# Patient Record
Sex: Female | Born: 1955 | Race: White | Hispanic: No | Marital: Married | Smoking: Former smoker
Health system: Southern US, Community
[De-identification: ages and names within clinical notes are randomized; demographics above are authoritative.]

## PROBLEM LIST (undated history)

## (undated) DIAGNOSIS — M1990 Unspecified osteoarthritis, unspecified site: Secondary | ICD-10-CM

## (undated) DIAGNOSIS — E079 Disorder of thyroid, unspecified: Secondary | ICD-10-CM

## (undated) DIAGNOSIS — E039 Hypothyroidism, unspecified: Secondary | ICD-10-CM

## (undated) DIAGNOSIS — M8580 Other specified disorders of bone density and structure, unspecified site: Secondary | ICD-10-CM

## (undated) DIAGNOSIS — K219 Gastro-esophageal reflux disease without esophagitis: Secondary | ICD-10-CM

## (undated) HISTORY — DX: Other specified disorders of bone density and structure, unspecified site: M85.80

## (undated) HISTORY — DX: Hypothyroidism, unspecified: E03.9

## (undated) HISTORY — DX: Unspecified osteoarthritis, unspecified site: M19.90

## (undated) HISTORY — DX: Gastro-esophageal reflux disease without esophagitis: K21.9

## (undated) HISTORY — PX: ELBOW SURGERY: SHX618

## (undated) HISTORY — DX: Disorder of thyroid, unspecified: E07.9

## (undated) HISTORY — PX: KNEE SURGERY: SHX244

---

## 1998-02-07 ENCOUNTER — Ambulatory Visit (HOSPITAL_COMMUNITY): Admission: RE | Admit: 1998-02-07 | Discharge: 1998-02-07 | Payer: Self-pay | Admitting: Obstetrics & Gynecology

## 1998-02-17 ENCOUNTER — Ambulatory Visit (HOSPITAL_COMMUNITY): Admission: RE | Admit: 1998-02-17 | Discharge: 1998-02-17 | Payer: Self-pay | Admitting: Obstetrics & Gynecology

## 1998-02-22 ENCOUNTER — Other Ambulatory Visit: Admission: RE | Admit: 1998-02-22 | Discharge: 1998-02-22 | Payer: Self-pay | Admitting: Obstetrics & Gynecology

## 1998-04-08 ENCOUNTER — Emergency Department (HOSPITAL_COMMUNITY): Admission: EM | Admit: 1998-04-08 | Discharge: 1998-04-08 | Payer: Self-pay | Admitting: Emergency Medicine

## 1998-05-23 ENCOUNTER — Other Ambulatory Visit: Admission: RE | Admit: 1998-05-23 | Discharge: 1998-05-23 | Payer: Self-pay | Admitting: Obstetrics & Gynecology

## 1998-08-26 ENCOUNTER — Ambulatory Visit (HOSPITAL_COMMUNITY): Admission: RE | Admit: 1998-08-26 | Discharge: 1998-08-26 | Payer: Self-pay | Admitting: Obstetrics & Gynecology

## 1998-11-11 ENCOUNTER — Other Ambulatory Visit: Admission: RE | Admit: 1998-11-11 | Discharge: 1998-11-11 | Payer: Self-pay | Admitting: Obstetrics & Gynecology

## 1999-02-28 ENCOUNTER — Ambulatory Visit (HOSPITAL_COMMUNITY): Admission: RE | Admit: 1999-02-28 | Discharge: 1999-02-28 | Payer: Self-pay | Admitting: Obstetrics & Gynecology

## 1999-02-28 ENCOUNTER — Encounter: Payer: Self-pay | Admitting: Obstetrics & Gynecology

## 2000-02-02 ENCOUNTER — Other Ambulatory Visit: Admission: RE | Admit: 2000-02-02 | Discharge: 2000-02-02 | Payer: Self-pay | Admitting: Obstetrics & Gynecology

## 2001-06-12 ENCOUNTER — Other Ambulatory Visit: Admission: RE | Admit: 2001-06-12 | Discharge: 2001-06-12 | Payer: Self-pay | Admitting: Thoracic Surgery

## 2003-04-07 ENCOUNTER — Other Ambulatory Visit: Admission: RE | Admit: 2003-04-07 | Discharge: 2003-04-07 | Payer: Self-pay | Admitting: Obstetrics & Gynecology

## 2003-07-22 ENCOUNTER — Encounter: Admission: RE | Admit: 2003-07-22 | Discharge: 2003-07-22 | Payer: Self-pay | Admitting: Obstetrics & Gynecology

## 2003-07-22 ENCOUNTER — Encounter: Payer: Self-pay | Admitting: Obstetrics & Gynecology

## 2003-08-02 ENCOUNTER — Encounter: Payer: Self-pay | Admitting: Family Medicine

## 2003-08-02 ENCOUNTER — Ambulatory Visit (HOSPITAL_COMMUNITY): Admission: RE | Admit: 2003-08-02 | Discharge: 2003-08-02 | Payer: Self-pay | Admitting: Family Medicine

## 2004-02-16 ENCOUNTER — Encounter: Admission: RE | Admit: 2004-02-16 | Discharge: 2004-02-16 | Payer: Self-pay | Admitting: Obstetrics & Gynecology

## 2004-04-21 ENCOUNTER — Other Ambulatory Visit: Admission: RE | Admit: 2004-04-21 | Discharge: 2004-04-21 | Payer: Self-pay | Admitting: Obstetrics and Gynecology

## 2005-09-21 ENCOUNTER — Other Ambulatory Visit: Admission: RE | Admit: 2005-09-21 | Discharge: 2005-09-21 | Payer: Self-pay | Admitting: Obstetrics & Gynecology

## 2005-10-02 ENCOUNTER — Encounter: Admission: RE | Admit: 2005-10-02 | Discharge: 2005-10-02 | Payer: Self-pay | Admitting: Obstetrics & Gynecology

## 2005-10-08 ENCOUNTER — Emergency Department (HOSPITAL_COMMUNITY): Admission: EM | Admit: 2005-10-08 | Discharge: 2005-10-08 | Payer: Self-pay | Admitting: Emergency Medicine

## 2005-10-09 ENCOUNTER — Ambulatory Visit (HOSPITAL_COMMUNITY): Admission: RE | Admit: 2005-10-09 | Discharge: 2005-10-09 | Payer: Self-pay | Admitting: Emergency Medicine

## 2006-04-03 ENCOUNTER — Encounter: Payer: Self-pay | Admitting: Obstetrics & Gynecology

## 2006-10-14 ENCOUNTER — Encounter: Admission: RE | Admit: 2006-10-14 | Discharge: 2006-10-14 | Payer: Self-pay | Admitting: Obstetrics & Gynecology

## 2006-10-23 ENCOUNTER — Encounter: Admission: RE | Admit: 2006-10-23 | Discharge: 2006-10-23 | Payer: Self-pay | Admitting: Obstetrics & Gynecology

## 2007-09-30 IMAGING — MG MM DIGITAL DIAGNOSTIC LIMITED*L*
2 series · 2 of 2 positions shown · non-contrast
Comparison: none

[REDACTED] LEFT
CC and MLO view(s) were taken of the left breast.

LEFT BREAST ULTRASOUND
DIGITAL LIMITED LEFT DIAGNOSTIC MAMMOGRAM AND LEFT BREAST ULTRASOUND:
CLINICAL DATA: Abnormal screening study.

[L CC]
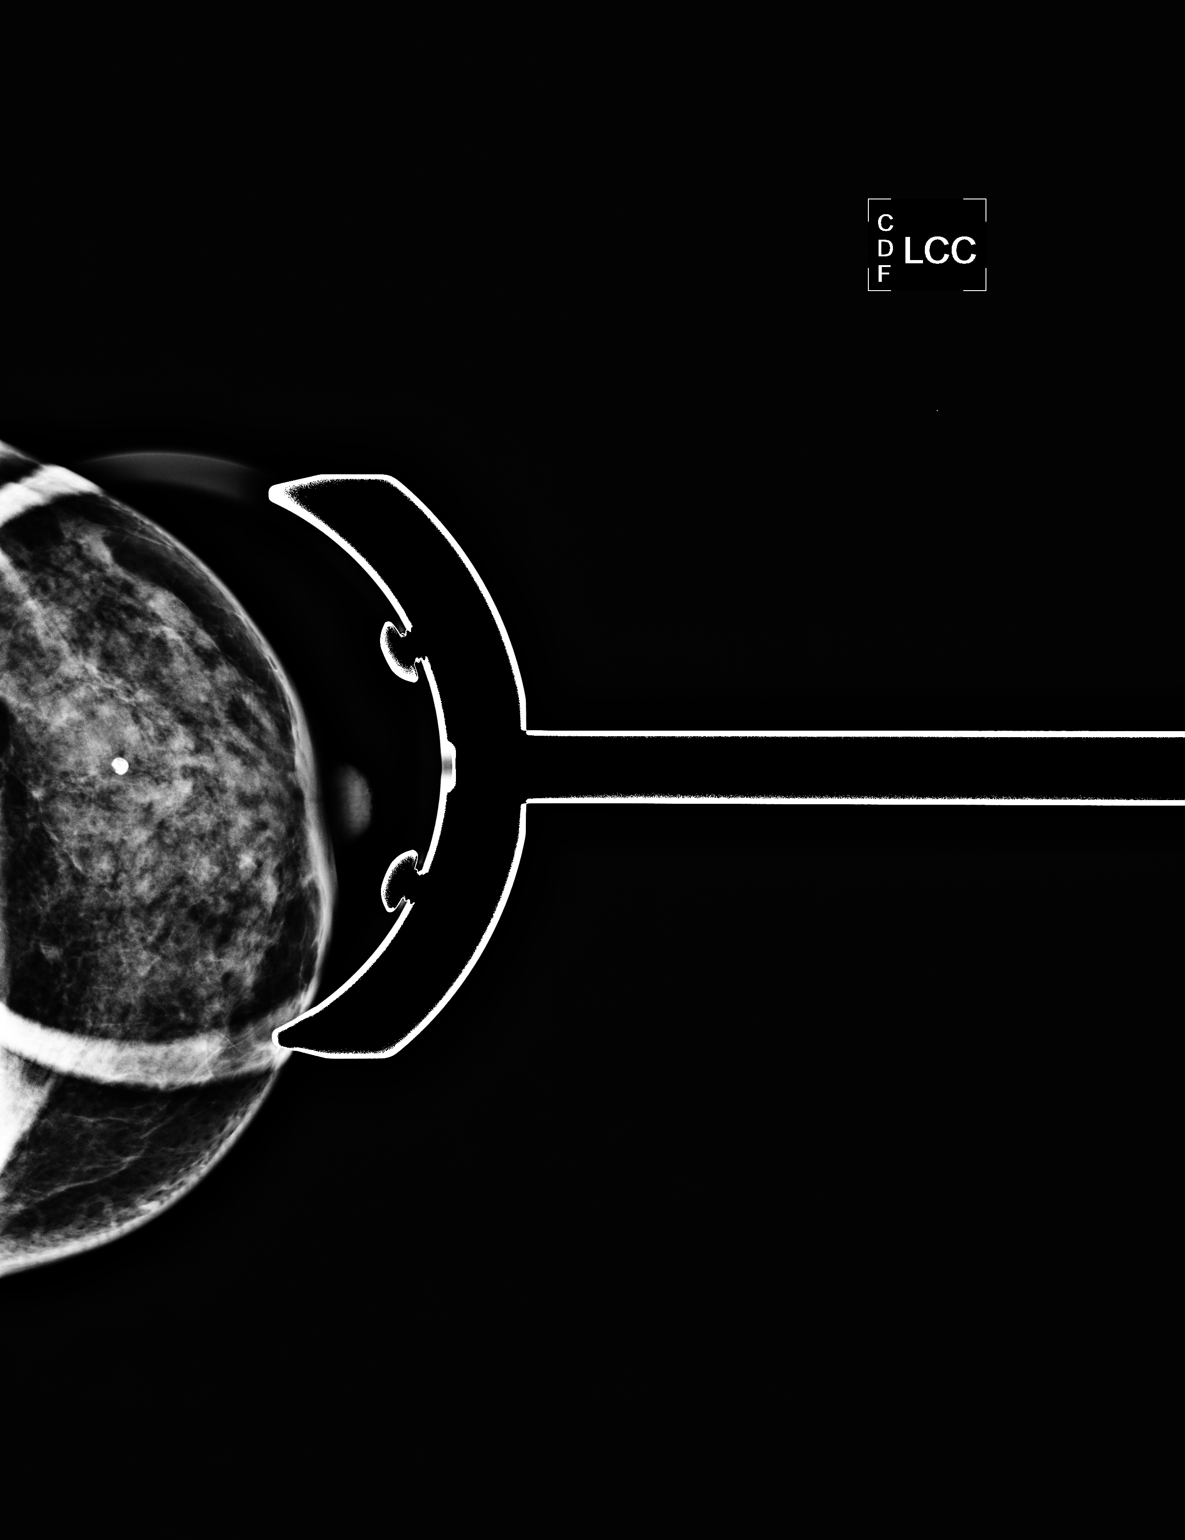

[L MLO]
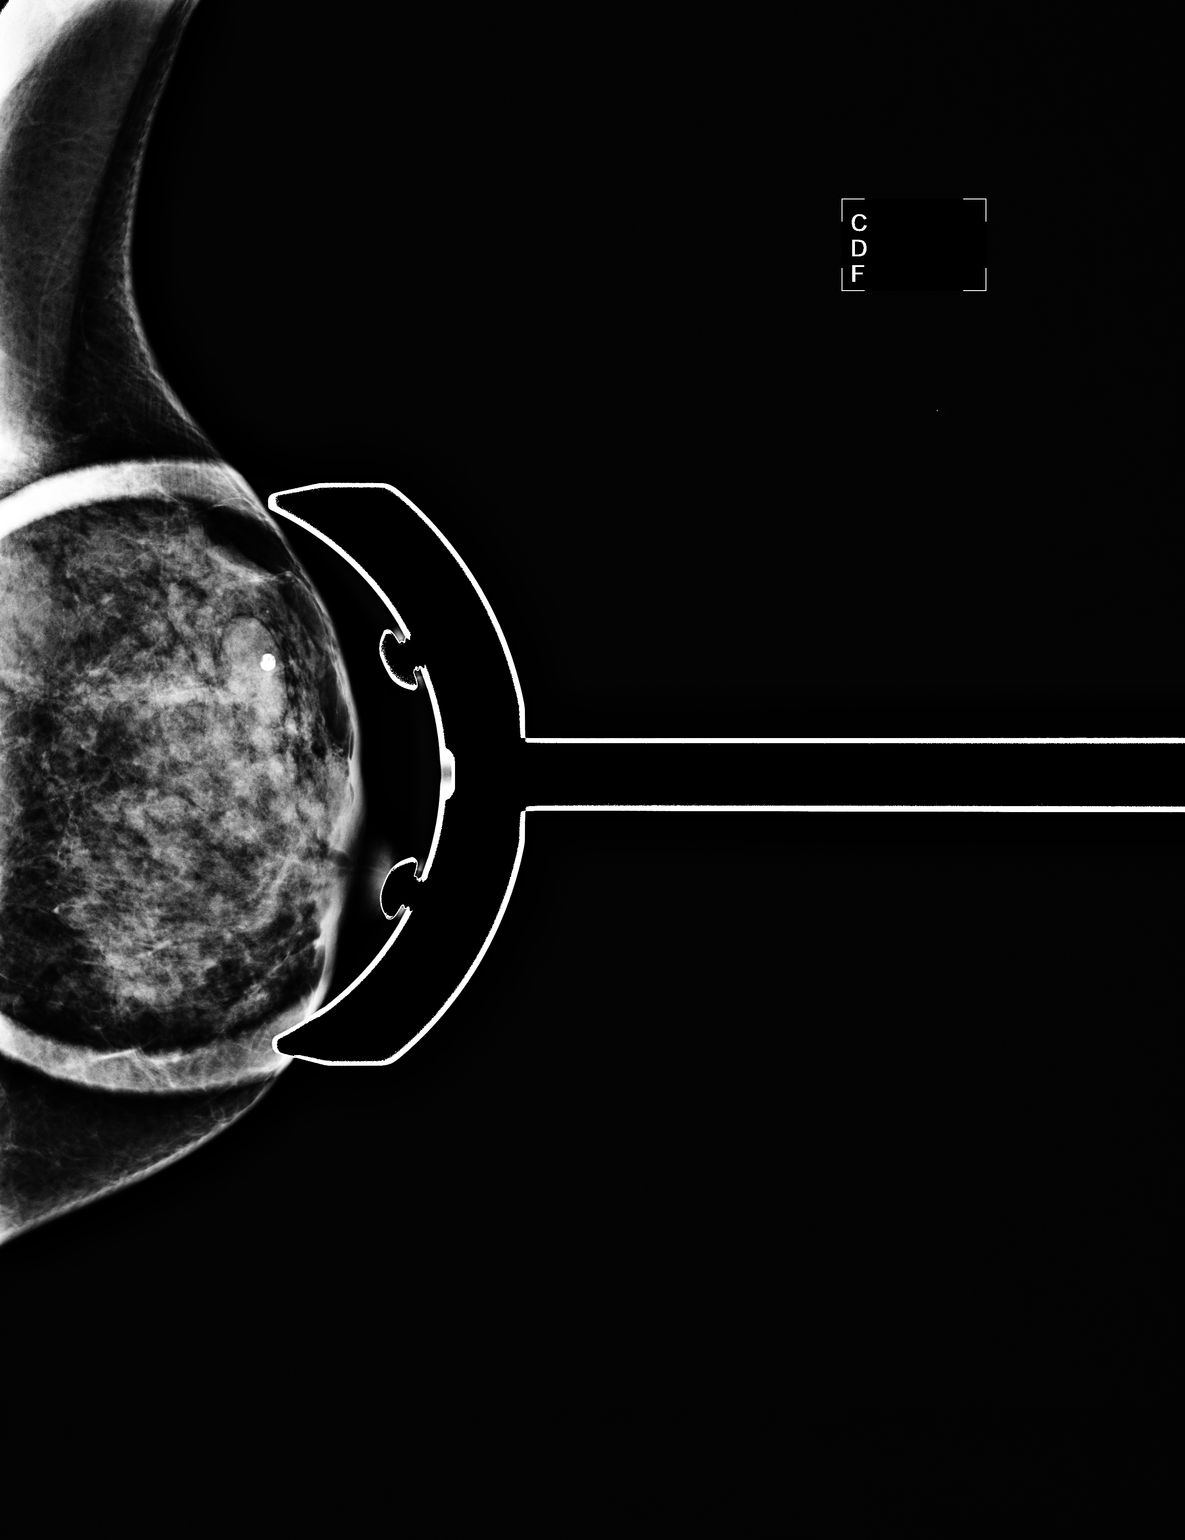

[2 of 2 positions shown; findings below may reference images not displayed]

Spot compression views of the upper outer quadrant of the left breast was performed.  There is a 
dense fibroglandular pattern.  There is persistence of an obscured mass.  Comparison is made to 
recent screening study dated 10-14-06 and prior studies dated 07-22-03.

On physical exam, I do not palpate a discrete mass in the left breast.  Sonographically, a simple 
cyst is imaged at 2 o'clock approximately 2 cm from the nipple measuring 1.2 x 0.7 x 1.4 cm.  There
is no solid mass or abnormal shadowing.
IMPRESSION: Left breast cyst.  No evidence of malignancy.  Screening mammogram in one year is recommended.

ASSESSMENT: Benign - BI-RADS 2

Screening mammogram of both breasts in 1 year.
,

## 2007-09-30 IMAGING — US US BREAST*L*
1 series · 4 of 4 positions shown · non-contrast
Comparison: none

[REDACTED] LEFT
CC and MLO view(s) were taken of the left breast.

LEFT BREAST ULTRASOUND
DIGITAL LIMITED LEFT DIAGNOSTIC MAMMOGRAM AND LEFT BREAST ULTRASOUND:
CLINICAL DATA: Abnormal screening study.

[Series 1: us breast*left* · 4 of 4 slices shown]
[im 1/4]
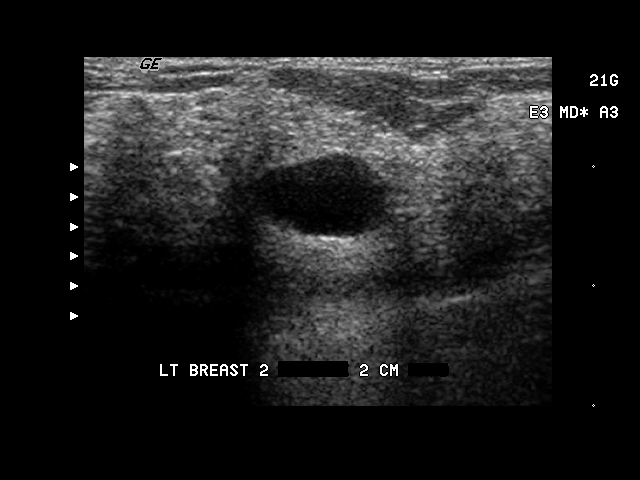
[im 2/4]
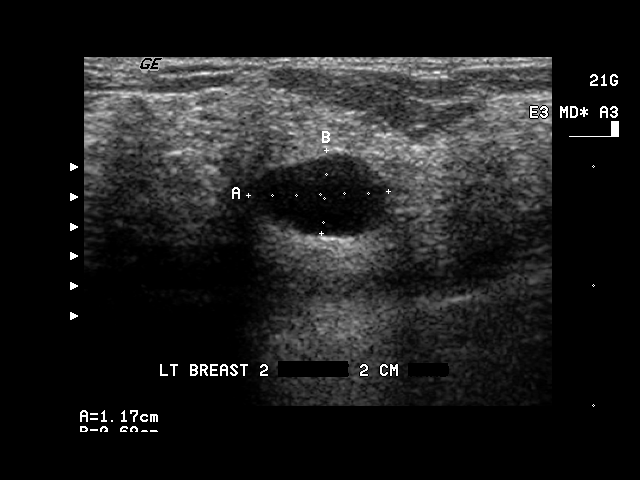
[im 3/4]
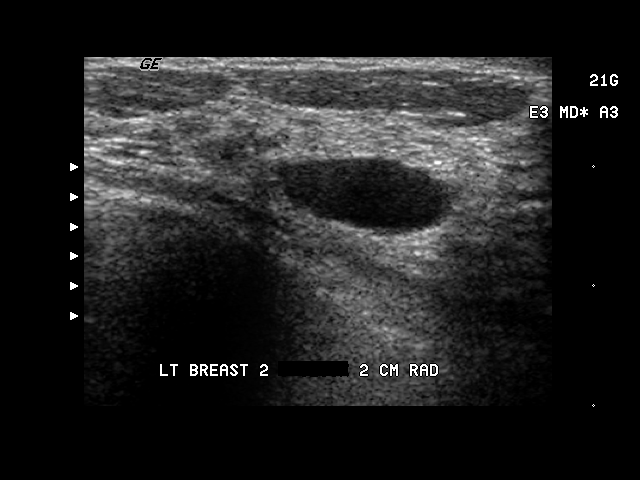
[im 4/4]
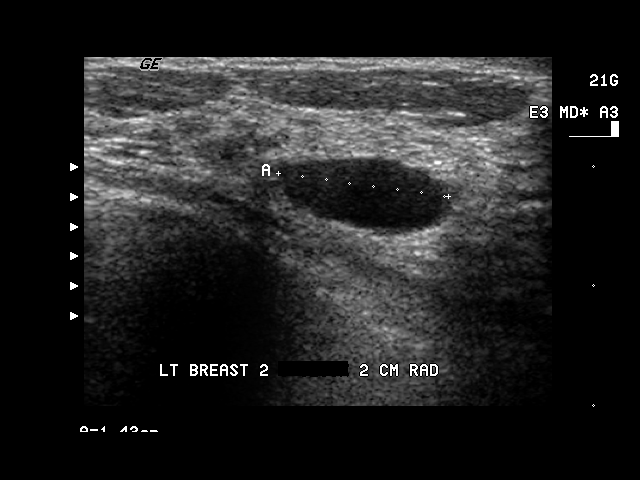

[4 of 4 positions shown; findings below may reference images not displayed]

Spot compression views of the upper outer quadrant of the left breast was performed.  There is a 
dense fibroglandular pattern.  There is persistence of an obscured mass.  Comparison is made to 
recent screening study dated 10-14-06 and prior studies dated 07-22-03.

On physical exam, I do not palpate a discrete mass in the left breast.  Sonographically, a simple 
cyst is imaged at 2 o'clock approximately 2 cm from the nipple measuring 1.2 x 0.7 x 1.4 cm.  There
is no solid mass or abnormal shadowing.
IMPRESSION: Left breast cyst.  No evidence of malignancy.  Screening mammogram in one year is recommended.

ASSESSMENT: Benign - BI-RADS 2

Screening mammogram of both breasts in 1 year.
,

## 2013-04-11 ENCOUNTER — Encounter (HOSPITAL_COMMUNITY): Payer: Self-pay | Admitting: Emergency Medicine

## 2013-04-11 ENCOUNTER — Emergency Department (HOSPITAL_COMMUNITY)
Admission: EM | Admit: 2013-04-11 | Discharge: 2013-04-11 | Disposition: A | Discharge status: Home / Self Care | Payer: BC Managed Care – PPO | Attending: Dermatology | Admitting: Dermatology

## 2013-04-11 DIAGNOSIS — M79609 Pain in unspecified limb: Secondary | ICD-10-CM

## 2013-04-11 DIAGNOSIS — F17200 Nicotine dependence, unspecified, uncomplicated: Secondary | ICD-10-CM | POA: Insufficient documentation

## 2013-04-11 DIAGNOSIS — M79661 Pain in right lower leg: Secondary | ICD-10-CM

## 2013-04-11 DIAGNOSIS — Z7982 Long term (current) use of aspirin: Secondary | ICD-10-CM | POA: Insufficient documentation

## 2013-04-11 DIAGNOSIS — M7989 Other specified soft tissue disorders: Secondary | ICD-10-CM

## 2013-04-11 DIAGNOSIS — M7121 Synovial cyst of popliteal space [Baker], right knee: Secondary | ICD-10-CM

## 2013-04-11 NOTE — ED Notes (Signed)
Spoke with Candice from Vascular. She is on the way to get pt for study.

## 2013-04-11 NOTE — Progress Notes (Signed)
VASCULAR LAB PRELIMINARY  PRELIMINARY  PRELIMINARY  PRELIMINARY  Right lower extremity venous Doppler completed.  Preliminary report:  There is no DVT or SVT noted in the right lower extremity.  There is a moderate sized Baker's Cyst noted in the right popliteal fossa.  Benyamin Jeff, RVT 04/11/2013, 9:08 AM

## 2013-04-11 NOTE — ED Provider Notes (Signed)
History     CSN: [id number]  Arrival date & time 04/11/13  0500   First MD Initiated Contact with Patient 04/11/13 0534      Chief Complaint  Patient presents with  . Leg Pain     HPI Pt was seen at 0600.   Per pt, c/o gradual onset and worsening of persistent right posterior knee "pain" and "swelling" that began last night.  Pt states her symptoms have progressively become worse over the course of the night.  Pt states she has a family hx of "blood clots in their legs" and is concerned regarding same today.  Denies personal hx of blood clots, no fevers, no injury, no rash, no focal motor weakness, no tingling/numbness in extremity, no joints pain/swelling.     History reviewed. No pertinent past medical history.  Past Surgical History  Procedure Laterality Date  . Cesarean section    . Knee surgery    . Elbow surgery       History  Substance Use Topics  . Smoking status: Current Every Day Smoker -- 0.25 packs/day    Types: Cigarettes  . Smokeless tobacco: Not on file  . Alcohol Use: No      Review of Systems ROS: Statement: All systems negative except as marked or noted in the HPI; Constitutional: Negative for fever and chills. ; ; Eyes: Negative for eye pain, redness and discharge. ; ; ENMT: Negative for ear pain, hoarseness, nasal congestion, sinus pressure and sore throat. ; ; Cardiovascular: Negative for chest pain, palpitations, diaphoresis, dyspnea and peripheral edema. ; ; Respiratory: Negative for cough, wheezing and stridor. ; ; Gastrointestinal: Negative for nausea, vomiting, diarrhea, abdominal pain, blood in stool, hematemesis, jaundice and rectal bleeding. . ; ; Genitourinary: Negative for dysuria, flank pain and hematuria. ; ; Musculoskeletal: +posterior RLE pain. Negative for back pain and neck pain. Negative for swelling and trauma.; ; Skin: Negative for pruritus, rash, abrasions, blisters, bruising and skin lesion.; ; Neuro: Negative for headache,  lightheadedness and neck stiffness. Negative for weakness, altered level of consciousness , altered mental status, extremity weakness, paresthesias, involuntary movement, seizure and syncope.      Allergies  Review of patient's allergies indicates no known allergies.  Home Medications   Current Outpatient Rx  Name  Route  Sig  Dispense  Refill  . aspirin 81 MG chewable tablet   Oral   Chew 81 mg by mouth daily.         . Multiple Vitamin (MULTIVITAMIN WITH MINERALS) TABS   Oral   Take 1 tablet by mouth daily.           BP 111/79  Pulse 71  Temp(Src) 97.5 F (36.4 C) (Oral)  Resp 23  Ht 5\' 4"  (1.626 m)  Wt 110 lb (49.896 kg)  BMI 18.87 kg/m2  SpO2 99%  Physical Exam 4098: Physical examination:  Nursing notes reviewed; Vital signs and O2 SAT reviewed;  Constitutional: Well developed, Well nourished, Well hydrated, In no acute distress; Head:  Normocephalic, atraumatic; Eyes: EOMI, PERRL, No scleral icterus; ENMT: Mouth and pharynx normal, Mucous membranes moist; Neck: Supple, Full range of motion, No lymphadenopathy; Cardiovascular: Regular rate and rhythm, No murmur, rub, or gallop; Respiratory: Breath sounds clear & equal bilaterally, No rales, rhonchi, wheezes.  Speaking full sentences with ease, Normal respiratory effort/excursion; Chest: Nontender, Movement normal; Abdomen: Soft, Nontender, Nondistended, Normal bowel sounds; Genitourinary: No CVA tenderness; Extremities: Pulses normal, +FROM right knee, including able to lift extended RLE off stretcher,  and extend right lower leg against resistance.  No ligamentous laxity.  No patellar or quad tendon step-offs.  NMS intact right foot, strong pedal pp. +plantarflexion of right foot w/calf squeeze.  No palpable gap right Achilles's tendon.  No proximal fibular head tenderness.  No erythema, warmth, ecchymosis or deformity to right knee. +mild tenderness and localized edema right popliteal fossa. No calf edema or asymmetry.;  Neuro: AA&Ox3, Major CN grossly intact.  Speech clear. Climbs on and off stretcher easily by herself.  Gait steady. No gross focal motor or sensory deficits in extremities.; Skin: Color normal, Warm, Dry.   ED Course  Procedures    MDM  MDM Reviewed: previous chart, nursing note and vitals    0705:  Vasc US RLE pending.  Sign out to Dr. Denton Lank.          Laray Anger, DO 04/11/13 [phone]

## 2013-04-11 NOTE — ED Provider Notes (Signed)
Pt signed out by Dr Clarene Duke, that vascular u/s pending to rule out dvt.  Discussed w vascular tech, reading below:  VASCULAR LAB  PRELIMINARY PRELIMINARY PRELIMINARY PRELIMINARY  Right lower extremity venous Doppler completed.  Preliminary report: There is no DVT or SVT noted in the right lower extremity. There is a moderate sized Baker's Cyst noted in the right popliteal fossa.  KANADY, CANDACE, RVT  04/11/2013, 9:08 AM   Discussed results w pt.   Pt appears stable for d/c.    Suzi Roots, MD 04/11/13 [phone]

## 2013-04-11 NOTE — ED Notes (Signed)
Patient states she went to work and her leg started hurting about 1730.  Patient states she noticed a knot behind knee on R leg.   Patient states that the knot increased in size around 0230.  Pain increased.  Patient states she has family history of blood clots.

## 2013-04-28 ENCOUNTER — Ambulatory Visit (INDEPENDENT_AMBULATORY_CARE_PROVIDER_SITE_OTHER): Payer: BC Managed Care – PPO | Admitting: Orthopedic Surgery

## 2013-04-28 ENCOUNTER — Ambulatory Visit (INDEPENDENT_AMBULATORY_CARE_PROVIDER_SITE_OTHER): Payer: BC Managed Care – PPO

## 2013-04-28 VITALS — BP 118/70 | Ht 64.0 in | Wt 112.0 lb

## 2013-04-28 DIAGNOSIS — M1710 Unilateral primary osteoarthritis, unspecified knee: Secondary | ICD-10-CM

## 2013-04-28 DIAGNOSIS — IMO0002 Reserved for concepts with insufficient information to code with codable children: Secondary | ICD-10-CM

## 2013-04-28 DIAGNOSIS — M25569 Pain in unspecified knee: Secondary | ICD-10-CM

## 2013-04-28 DIAGNOSIS — M25561 Pain in right knee: Secondary | ICD-10-CM

## 2013-04-28 NOTE — Patient Instructions (Addendum)
Call office when you need a shot Bakers cyst and osteoarthritis knee Baker's Cyst A Baker's cyst is a swelling that forms in the back of the knee. It is a sac-like structure. It is filled with the same fluid that is located in your knee. The fluid located in your knee is necessary because it lubricates the bones and cartilage. It allows them to move over each other more easily. CAUSES  When the knee becomes injured or has soreness (inflammation) present, more fluid forms in the knee. When this happens, the joint lining is pushed out behind the knee and forms the baker's cyst. This cyst may also be caused by inflammation from arthritic conditions and infections. DIAGNOSIS  A Baker's cyst is most often diagnosed with an ultrasound. This is a specialized picture (like an X-ray). It shows a picture by using sound waves. Sometimes a specialized x-ray called an MRI (magnetic resonance imaging) is used. This picks up other problems within a joint if an ultrasound alone cannot make the diagnosis. If the cyst came immediately following an injury, plain x-rays may be used to make a diagnosis. TREATMENT  The treatment depends on the cause of the cyst. But most of these cysts are caused by an inflammation. Anti-inflammatory medications and rest often will get rid of the problem. If the cyst is caused by an infection, medications (antibiotics) will be prescribed to help this. Take the medications as directed. Refer to Home Care Instructions, below, for additional treatment suggestions. HOME CARE INSTRUCTIONS   If the cyst was caused by an injury, for the first 24 hours, while lying down, keep the injured extremity elevated on 2 pillows.  For the first 24 hours while you are awake, apply ice bags (ice in a plastic bag with a towel around it to prevent frostbite to skin) 3-4 times per day for 15-20 minutes to the injured area. Then do as directed by your caregiver.  Only take over-the-counter or prescription  medicines for pain, discomfort, or fever as directed by your caregiver. Persistent pain and inability to use the injured area for more than 2 to 3 days are warning signs indicating that you should see a caregiver for a follow-up visit as soon as possible. Persistent pain and swelling indicate that further evaluation, non-weight bearing (use of crutches as instructed), and/or further x-rays are needed. Make a follow-up appointment with your own caregiver. If conservative measures (rest, medications and inactivity) do not help the problem get better, sometimes surgery for removal of the cyst is needed. Reasons for this may be that the cyst is pressing on nerves and/or vessels and causing problems which cannot wait for improvement with conservative treatment. If the problem is caused by injuries to the cartilage in the knee, surgery is often needed for treatment of that problem. MAKE SURE YOU:   Understand these instructions.  Will watch your condition.  Will get help right away if you are not doing well or get worse. Document Released: 11/19/2005 Document Revised: 02/11/2012 Document Reviewed: 07/07/2008 [facility] Patient Information 2014 [place], [place].

## 2013-04-29 ENCOUNTER — Encounter: Payer: Self-pay | Admitting: Orthopedic Surgery

## 2013-04-29 DIAGNOSIS — M1710 Unilateral primary osteoarthritis, unspecified knee: Secondary | ICD-10-CM | POA: Insufficient documentation

## 2013-04-29 NOTE — Progress Notes (Signed)
Patient ID: Lori Sawyer, female   DOB: 1956-05-21, 57 y.o.   MRN: [id number] Chief Complaint  Patient presents with  . Knee Pain    Right knee pain and swelling, cyst behind knee   Patient history: This is a 57 year old female who had a knee arthroscopy and partial medial meniscectomy back in 2007. At that time the doctor told her that she would probably need knee replacement in the future. She did well until 2009 her knee started hurting again. She did not have insurance so she cut with for the last 5 years. This month however swelling became more frequent and she felt pain and swelling in the back of her knee. Her father has a history of DVT and pulmonary embolus which took his life so she was concerned and went for evaluation. She eventually had an ultrasound which showed a Baker's cyst uric she has been self treating with ibuprofen 2-4 per day ice and a knee wrap. She ambulates frequently with 12 hour shifts at work and when she does her pain increases. She is wary of anti-inflammatories because of a friend of hers died from taking Celebrex  She complains of sharp dull throbbing carotid 10 intermittent pain which is improved with elevation and rest and worse when she is walking squatting or bending her knee. She reports frequent knee effusions which are relieved by ice and elevation. Other symptoms include numbness tingling and some bruising over the leg  The patient reported all systems as having something positive but when questioned she says that she's had them in the past and doesn't currently have any of the area  The past, family history and social history have been reviewed and are recorded in the Epic section   Vital signs are stable as recorded  General appearance is normal  The patient is alert and oriented x3  The patient's mood and affect are normal  Gait assessment: She ambulates normally. There is no limp.  The cardiovascular exam reveals normal pulses and temperature  without edema or  swelling.  The lymphatic system is negative for palpable lymph nodes  The sensory exam is normal.  There are no pathologic reflexes.  Balance is normal.   Exam of the right knee Inspection normal alignment, no joint effusion. Fullness noted in the back of the knee consistent with a Baker's cyst. Medial joint line is tender. Range of motion she maintains full range of motion Stability the anterior cruciate ligament, PCL, medial and lateral collateral ligaments are stable Strength muscle tone normal Skin normal without rash Provocative tests for medial meniscus tear are normal  X-rays reveal medial joint space narrowing consistent with osteoarthritis  Impression osteoarthritis  The patient was offered the following treatment anti-inflammatories, cortisone injection, cartilage injection, physical therapy, arthroscopy, she was told she is not ready for knee replacement surgery based on her x-rays.  She opted to continue ibuprofen. She will call us if she needs an aspiration.

## 2014-11-11 ENCOUNTER — Other Ambulatory Visit: Payer: Self-pay | Admitting: Obstetrics & Gynecology

## 2014-11-11 DIAGNOSIS — R928 Other abnormal and inconclusive findings on diagnostic imaging of breast: Secondary | ICD-10-CM

## 2014-12-01 ENCOUNTER — Other Ambulatory Visit: Payer: BC Managed Care – PPO

## 2014-12-23 ENCOUNTER — Other Ambulatory Visit: Payer: Self-pay

## 2014-12-24 ENCOUNTER — Other Ambulatory Visit: Payer: Self-pay

## 2015-12-26 ENCOUNTER — Encounter: Payer: Self-pay | Admitting: Internal Medicine

## 2016-01-11 ENCOUNTER — Other Ambulatory Visit: Payer: Self-pay | Admitting: Obstetrics & Gynecology

## 2016-01-11 DIAGNOSIS — R928 Other abnormal and inconclusive findings on diagnostic imaging of breast: Secondary | ICD-10-CM

## 2016-01-16 ENCOUNTER — Ambulatory Visit
Admission: RE | Admit: 2016-01-16 | Discharge: 2016-01-16 | Disposition: A | Discharge status: Home / Self Care | Payer: BLUE CROSS/BLUE SHIELD | Source: Ambulatory Visit | Attending: Obstetrics & Gynecology | Admitting: Obstetrics & Gynecology

## 2016-01-16 DIAGNOSIS — R928 Other abnormal and inconclusive findings on diagnostic imaging of breast: Secondary | ICD-10-CM

## 2016-02-21 ENCOUNTER — Ambulatory Visit: Payer: BLUE CROSS/BLUE SHIELD | Admitting: Urology

## 2016-03-22 ENCOUNTER — Emergency Department (HOSPITAL_COMMUNITY): Payer: BLUE CROSS/BLUE SHIELD

## 2016-03-22 ENCOUNTER — Encounter (HOSPITAL_COMMUNITY): Payer: Self-pay | Admitting: Cardiology

## 2016-03-22 ENCOUNTER — Emergency Department (HOSPITAL_COMMUNITY)
Admission: EM | Admit: 2016-03-22 | Discharge: 2016-03-22 | Disposition: A | Discharge status: Home / Self Care | Payer: BLUE CROSS/BLUE SHIELD | Attending: Emergency Medicine | Admitting: Emergency Medicine

## 2016-03-22 DIAGNOSIS — N390 Urinary tract infection, site not specified: Secondary | ICD-10-CM | POA: Diagnosis not present

## 2016-03-22 DIAGNOSIS — M545 Low back pain: Secondary | ICD-10-CM | POA: Diagnosis present

## 2016-03-22 DIAGNOSIS — Z79899 Other long term (current) drug therapy: Secondary | ICD-10-CM | POA: Diagnosis not present

## 2016-03-22 DIAGNOSIS — Z87891 Personal history of nicotine dependence: Secondary | ICD-10-CM | POA: Diagnosis not present

## 2016-03-22 HISTORY — DX: Unspecified osteoarthritis, unspecified site: M19.90

## 2016-03-22 LAB — URINALYSIS, ROUTINE W REFLEX MICROSCOPIC
BILIRUBIN URINE: NEGATIVE
GLUCOSE, UA: NEGATIVE mg/dL
KETONES UR: NEGATIVE mg/dL
Nitrite: NEGATIVE
PROTEIN: NEGATIVE mg/dL
Specific Gravity, Urine: 1.01 (ref 1.005–1.030)
pH: 5.5 (ref 5.0–8.0)

## 2016-03-22 LAB — CBC WITH DIFFERENTIAL/PLATELET
BASOS PCT: 1 %
Basophils Absolute: 0.1 10*3/uL (ref 0.0–0.1)
Eosinophils Absolute: 0.1 10*3/uL (ref 0.0–0.7)
Eosinophils Relative: 1 %
HEMATOCRIT: 39.9 % (ref 36.0–46.0)
HEMOGLOBIN: 13.6 g/dL (ref 12.0–15.0)
LYMPHS ABS: 1.3 10*3/uL (ref 0.7–4.0)
LYMPHS PCT: 12 %
MCH: 31.6 pg (ref 26.0–34.0)
MCHC: 34.1 g/dL (ref 30.0–36.0)
MCV: 92.6 fL (ref 78.0–100.0)
MONO ABS: 1.3 10*3/uL — AB (ref 0.1–1.0)
MONOS PCT: 12 %
NEUTROS ABS: 8 10*3/uL — AB (ref 1.7–7.7)
NEUTROS PCT: 74 %
Platelets: 217 10*3/uL (ref 150–400)
RBC: 4.31 MIL/uL (ref 3.87–5.11)
RDW: 13.3 % (ref 11.5–15.5)
WBC: 10.8 10*3/uL — ABNORMAL HIGH (ref 4.0–10.5)

## 2016-03-22 LAB — BASIC METABOLIC PANEL
Anion gap: 10 (ref 5–15)
BUN: 19 mg/dL (ref 6–20)
CALCIUM: 9.6 mg/dL (ref 8.9–10.3)
CHLORIDE: 100 mmol/L — AB (ref 101–111)
CO2: 27 mmol/L (ref 22–32)
CREATININE: 0.98 mg/dL (ref 0.44–1.00)
GFR calc non Af Amer: >60 mL/min (ref >60)
GLUCOSE: 108 mg/dL — AB (ref 65–99)
Potassium: 3.8 mmol/L (ref 3.5–5.1)
Sodium: 137 mmol/L (ref 135–145)

## 2016-03-22 LAB — URINE MICROSCOPIC-ADD ON

## 2016-03-22 MED ORDER — CEPHALEXIN 500 MG PO CAPS: 500.0000 mg | ORAL_CAPSULE | Freq: Four times a day (QID) | ORAL | Status: DC

## 2016-03-22 MED ORDER — HYDROMORPHONE HCL 1 MG/ML IJ SOLN
1.0000 mg | Freq: Once | INTRAMUSCULAR | Status: AC
  Administered 2016-03-22: 1 mg via INTRAVENOUS
  Filled 2016-03-22: qty 1

## 2016-03-22 MED ORDER — ONDANSETRON HCL 4 MG/2ML IJ SOLN
4.0000 mg | Freq: Once | INTRAMUSCULAR | Status: AC
  Administered 2016-03-22: 4 mg via INTRAVENOUS
  Filled 2016-03-22: qty 2

## 2016-03-22 MED ORDER — HYDROCODONE-ACETAMINOPHEN 5-325 MG PO TABS: 2.0000 | ORAL_TABLET | ORAL | Status: DC | PRN

## 2016-03-22 NOTE — ED Notes (Signed)
Patient with no complaints at this time. Respirations even and unlabored. Skin warm/dry. Discharge instructions reviewed with patient at this time. Patient given opportunity to voice concerns/ask questions. IV removed per policy and band-aid applied to site. Patient discharged at this time and left Emergency Department via wheelchair.  

## 2016-03-22 NOTE — Discharge Instructions (Signed)

## 2016-03-22 NOTE — ED Notes (Addendum)
Lower back pain, fever  and difficulty urinating times one day.  Appointment with urology next week for enlarged bladder.

## 2016-03-22 NOTE — ED Notes (Signed)
Patient able to void 100 mls urine. Due to patient pain, RN completed post void catheterization. PVR cath 100 mls. Patient's pain only minimally better after cath. PA made aware.

## 2016-03-23 NOTE — ED Provider Notes (Signed)
CSN: [id number]     Arrival date & time 03/22/16  0908 History   First MD Initiated Contact with Patient 03/22/16 1002     Chief Complaint  Patient presents with  . Back Pain     (Consider location/radiation/quality/duration/timing/severity/associated sxs/prior Treatment) Patient is a 60 y.o. female presenting with back pain. The history is provided by the patient. No language interpreter was used.  Back Pain Location:  Lumbar spine Quality:  Aching Pain severity:  Moderate Pain is:  Same all the time Onset quality:  Gradual Duration:  1 day Timing:  Constant Progression:  Worsening Chronicity:  New Relieved by:  Nothing Worsened by:  Nothing tried Ineffective treatments:  None tried Risk factors: no hx of cancer   Pt reports she feels like she can not empty her bladder, feels swollen,  Pt also complains of back pain and having spasm.  Past Medical History  Diagnosis Date  . Arthritis    Past Surgical History  Procedure Laterality Date  . Cesarean section    . Knee surgery    . Elbow surgery     History reviewed. No pertinent family history. Social History  Substance Use Topics  . Smoking status: Former Smoker -- 0.25 packs/day  . Smokeless tobacco: None  . Alcohol Use: No   OB History    No data available     Review of Systems  Musculoskeletal: Positive for back pain.  All other systems reviewed and are negative.     Allergies  Review of patient's allergies indicates no known allergies.  Home Medications   Prior to Admission medications   Medication Sig Start Date End Date Taking? Authorizing Provider  atorvastatin (LIPITOR) 10 MG tablet Take 10 mg by mouth daily.   Yes Historical Provider, MD  cholecalciferol (VITAMIN D) 1000 units tablet Take 1,000 Units by mouth daily.   Yes Historical Provider, MD  estradiol (ESTRACE) 2 MG tablet Take 2 mg by mouth daily.   Yes Historical Provider, MD  levothyroxine (SYNTHROID, LEVOTHROID) 100 MCG tablet Take 100  mcg by mouth daily before breakfast.   Yes Historical Provider, MD  Multiple Vitamin (MULTIVITAMIN WITH MINERALS) TABS Take 1 tablet by mouth daily.   Yes Historical Provider, MD  cephALEXin (KEFLEX) 500 MG capsule Take 1 capsule (500 mg total) by mouth 4 (four) times daily. 03/22/16   Elson Areas, PA-C  HYDROcodone-acetaminophen (NORCO/VICODIN) 5-325 MG tablet Take 2 tablets by mouth every 4 (four) hours as needed. 03/22/16   Elson Areas, PA-C   BP 106/51 mmHg  Pulse 89  Temp(Src) 97.7 F (36.5 C) (Oral)  Resp 18  Ht  (1.626 m)  Wt 54.432 kg  BMI 20.59 kg/m2  SpO2 98% Physical Exam  Constitutional: She is oriented to person, place, and time. She appears well-developed and well-nourished.  HENT:  Head: Normocephalic.  Right Ear: External ear normal.  Left Ear: External ear normal.  Nose: Nose normal.  Mouth/Throat: Oropharynx is clear and moist.  Eyes: Conjunctivae and EOM are normal. Pupils are equal, round, and reactive to light.  Neck: Normal range of motion.  Cardiovascular: Normal rate and normal heart sounds.   Pulmonary/Chest: Effort normal.  Abdominal: She exhibits no distension.  Musculoskeletal: Normal range of motion.  Neurological: She is alert and oriented to person, place, and time.  Psychiatric: She has a normal mood and affect.  Nursing note and vitals reviewed.   ED Course  Procedures (including critical care time) Labs Review Labs Reviewed  URINALYSIS, ROUTINE W REFLEX MICROSCOPIC (NOT AT [facility]) - Abnormal; Notable for the following:    Hgb urine dipstick TRACE (*)    Leukocytes, UA MODERATE (*)    All other components within normal limits  CBC WITH DIFFERENTIAL/PLATELET - Abnormal; Notable for the following:    WBC 10.8 (*)    Neutro Abs 8.0 (*)    Monocytes Absolute 1.3 (*)    All other components within normal limits  BASIC METABOLIC PANEL - Abnormal; Notable for the following:    Chloride 100 (*)    Glucose, Bld 108 (*)    All other  components within normal limits  URINE MICROSCOPIC-ADD ON - Abnormal; Notable for the following:    Squamous Epithelial / LPF 0-5 (*)    Bacteria, UA MANY (*)    All other components within normal limits  URINE CULTURE    Imaging Review Ct Renal Stone Study  03/22/2016  CLINICAL DATA:  Left lower quadrant pain starting today. EXAM: CT ABDOMEN AND PELVIS WITHOUT CONTRAST TECHNIQUE: Multidetector CT imaging of the abdomen and pelvis was performed following the standard protocol without IV contrast. COMPARISON:  None. FINDINGS: Lower chest and abdominal wall:  No contributory findings. Hepatobiliary: No focal liver abnormality.No evidence of biliary obstruction or stone. Pancreas: Unremarkable. Spleen: Unremarkable. Adrenals/Urinary Tract: Negative adrenals. No hydronephrosis or ureteral stone. Punctate right nephrolithiasis. 2 mm long crescentic low density right of the Foley catheter tubing consistent with a urethral diverticulum. No internal calculus. Reproductive:No pathologic findings. Stomach/Bowel: No obstruction or inflammation. Distal colonic diverticulosis. No appendicitis. Vascular/Lymphatic: No acute vascular abnormality. No mass or adenopathy. Peritoneal: No ascites or pneumoperitoneum. Musculoskeletal: No acute abnormalities. IMPRESSION: 1. No acute finding. 2. Punctate right nephrolithiasis. 3. 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 4. Colonic diverticulosis. Electronically Signed   By: Marnee Spring M.D.   On: 03/22/2016 12:10   I have personally reviewed and evaluated these images and lab results as part of my medical decision-making.   EKG Interpretation None      MDM RN did cath.   Only 100cc of urine.  Ct scans show no evidence of a stone. Urethral diverticulum.  Pt counseled on results.  Pt is scheduled to see Urologist next week.  Pt counseled on labs and uti, culture pending    Final diagnoses:  UTI (lower urinary tract infection)   Meds  ordered this encounter  Medications  . atorvastatin (LIPITOR) 10 MG tablet    Sig: Take 10 mg by mouth daily.  Marland Kitchen levothyroxine (SYNTHROID, LEVOTHROID) 100 MCG tablet    Sig: Take 100 mcg by mouth daily before breakfast.  . estradiol (ESTRACE) 2 MG tablet    Sig: Take 2 mg by mouth daily.  . cholecalciferol (VITAMIN D) 1000 units tablet    Sig: Take 1,000 Units by mouth daily.  Marland Kitchen HYDROmorphone (DILAUDID) injection 1 mg    Sig:   . ondansetron (ZOFRAN) injection 4 mg    Sig:   . cephALEXin (KEFLEX) 500 MG capsule    Sig: Take 1 capsule (500 mg total) by mouth 4 (four) times daily.    Dispense:  40 capsule    Refill:  0    Order Specific Question:  Supervising Provider    Answer:  Chesnut, BRIAN [[id number]]  . HYDROcodone-acetaminophen (NORCO/VICODIN) 5-325 MG tablet    Sig: Take 2 tablets by mouth every 4 (four) hours as needed.    Dispense:  20 tablet    Refill:  0  Order Specific Question:  Supervising Provider    Answer:  Eber HongMILLER, BRIAN [[id number]]  An After Visit Summary was printed and given to the patient.    Lonia SkinnerLeslie K PetersburgSofia, PA-C 03/23/16 [phone]  Bethann BerkshireJoseph Zammit, MD 03/23/16 [phone]

## 2016-03-24 LAB — URINE CULTURE

## 2016-03-27 ENCOUNTER — Ambulatory Visit (INDEPENDENT_AMBULATORY_CARE_PROVIDER_SITE_OTHER): Payer: BLUE CROSS/BLUE SHIELD | Admitting: Urology

## 2016-03-27 DIAGNOSIS — R350 Frequency of micturition: Secondary | ICD-10-CM | POA: Diagnosis not present

## 2016-03-27 DIAGNOSIS — N941 Unspecified dyspareunia: Secondary | ICD-10-CM

## 2016-03-27 DIAGNOSIS — N3946 Mixed incontinence: Secondary | ICD-10-CM

## 2016-12-18 ENCOUNTER — Other Ambulatory Visit: Payer: Self-pay | Admitting: Obstetrics & Gynecology

## 2016-12-18 DIAGNOSIS — Z1231 Encounter for screening mammogram for malignant neoplasm of breast: Secondary | ICD-10-CM

## 2017-01-21 ENCOUNTER — Inpatient Hospital Stay: Admission: RE | Admit: 2017-01-21 | Payer: BLUE CROSS/BLUE SHIELD | Source: Ambulatory Visit

## 2017-10-12 ENCOUNTER — Other Ambulatory Visit: Payer: Self-pay | Admitting: Nurse Practitioner

## 2020-04-25 ENCOUNTER — Other Ambulatory Visit: Payer: Self-pay | Admitting: Internal Medicine

## 2020-04-25 DIAGNOSIS — Z1231 Encounter for screening mammogram for malignant neoplasm of breast: Secondary | ICD-10-CM

## 2020-05-17 ENCOUNTER — Ambulatory Visit: Payer: BLUE CROSS/BLUE SHIELD

## 2020-05-23 ENCOUNTER — Encounter: Payer: Self-pay | Admitting: Internal Medicine

## 2020-05-26 ENCOUNTER — Other Ambulatory Visit: Payer: Self-pay

## 2020-05-26 ENCOUNTER — Ambulatory Visit
Admission: RE | Admit: 2020-05-26 | Discharge: 2020-05-26 | Disposition: A | Discharge status: Home / Self Care | Payer: BC Managed Care – PPO | Source: Ambulatory Visit | Attending: Internal Medicine | Admitting: Internal Medicine

## 2020-05-26 DIAGNOSIS — Z1231 Encounter for screening mammogram for malignant neoplasm of breast: Secondary | ICD-10-CM

## 2020-06-29 ENCOUNTER — Other Ambulatory Visit: Payer: Self-pay

## 2020-06-29 ENCOUNTER — Encounter: Payer: Self-pay | Admitting: Emergency Medicine

## 2020-06-29 ENCOUNTER — Ambulatory Visit
Admission: EM | Admit: 2020-06-29 | Discharge: 2020-06-29 | Disposition: A | Discharge status: Home / Self Care | Payer: BC Managed Care – PPO | Attending: Emergency Medicine | Admitting: Emergency Medicine

## 2020-06-29 DIAGNOSIS — R059 Cough, unspecified: Secondary | ICD-10-CM

## 2020-06-29 DIAGNOSIS — Z20822 Contact with and (suspected) exposure to covid-19: Secondary | ICD-10-CM

## 2020-06-29 DIAGNOSIS — J069 Acute upper respiratory infection, unspecified: Secondary | ICD-10-CM

## 2020-06-29 DIAGNOSIS — R05 Cough: Secondary | ICD-10-CM

## 2020-06-29 MED ORDER — BENZONATATE 100 MG PO CAPS: 100.0000 mg | ORAL_CAPSULE | Freq: Three times a day (TID) | ORAL | 0 refills | Status: DC

## 2020-06-29 NOTE — Discharge Instructions (Addendum)
COVID testing ordered.  It will take between 2-5 days for test results.  Someone will contact you regarding abnormal results.    In the meantime: You should remain isolated in your home for 10 days from symptom onset AND greater than 72 hours after symptoms resolution (absence of fever without the use of fever-reducing medication and improvement in respiratory symptoms), whichever is longer Get plenty of rest and push fluids Tessalon Perles prescribed for cough Use OTC zyrtec for nasal congestion, runny nose, and/or sore throat Use OTC flonase for nasal congestion and runny nose Use medications daily for symptom relief Use OTC medications like ibuprofen or tylenol as needed fever or pain Follow up with PCP regarding results Call or go to the ED if you have any new or worsening symptoms such as fever, worsening cough, shortness of breath, chest tightness, chest pain, turning blue, changes in mental status, etc...  

## 2020-06-29 NOTE — ED Provider Notes (Signed)
[facility]   [id number] 06/29/20 Arrival Time: 1033   CC: COVID symptoms  SUBJECTIVE: History from: patient.  Lori Sawyer is a 64 y.o. female who presents with fever, body aches, runny nose, congestion, sore throat, cough, fatigue, nausea, and diarrhea x 4 days.  Denies sick exposure to COVID, flu or strep.  Has tried OTC tylenol with relief.  Denies aggravating factors.  Denies previous COVID infection.   Denies SOB, wheezing, chest pain, changes in bladder habits.    ROS: As per HPI.  All other pertinent ROS negative.     Past Medical History:  Diagnosis Date  . Arthritis    Past Surgical History:  Procedure Laterality Date  . CESAREAN SECTION    . ELBOW SURGERY    . KNEE SURGERY     No Known Allergies No current facility-administered medications on file prior to encounter.   Current Outpatient Medications on File Prior to Encounter  Medication Sig Dispense Refill  . atorvastatin (LIPITOR) 10 MG tablet Take 10 mg by mouth daily.    . cephALEXin (KEFLEX) 500 MG capsule Take 1 capsule (500 mg total) by mouth 4 (four) times daily. 40 capsule 0  . cholecalciferol (VITAMIN D) 1000 units tablet Take 1,000 Units by mouth daily.    Marland Kitchen estradiol (ESTRACE) 2 MG tablet Take 2 mg by mouth daily.    Marland Kitchen HYDROcodone-acetaminophen (NORCO/VICODIN) 5-325 MG tablet Take 2 tablets by mouth every 4 (four) hours as needed. 20 tablet 0  . levothyroxine (SYNTHROID, LEVOTHROID) 100 MCG tablet Take 100 mcg by mouth daily before breakfast.    . Multiple Vitamin (MULTIVITAMIN WITH MINERALS) TABS Take 1 tablet by mouth daily.     Social History   Socioeconomic History  . Marital status: Married    Spouse name: Not on file  . Number of children: Not on file  . Years of education: Not on file  . Highest education level: Not on file  Occupational History  . Not on file  Tobacco Use  . Smoking status: Former Smoker    Packs/day: 0.25  . Smokeless tobacco: Never Used    Substance and Sexual Activity  . Alcohol use: No  . Drug use: No  . Sexual activity: Not on file  Other Topics Concern  . Not on file  Social History Narrative  . Not on file   Social Determinants of Health   Financial Resource Strain:   . Difficulty of Paying Living Expenses:   Food Insecurity:   . Worried About Programme researcher, broadcasting/film/video in the Last Year:   . Barista in the Last Year:   Transportation Needs:   . Freight forwarder (Medical):   Marland Kitchen Lack of Transportation (Non-Medical):   Physical Activity:   . Days of Exercise per Week:   . Minutes of Exercise per Session:   Stress:   . Feeling of Stress :   Social Connections:   . Frequency of Communication with Friends and Family:   . Frequency of Social Gatherings with Friends and Family:   . Attends Religious Services:   . Active Member of Clubs or Organizations:   . Attends Banker Meetings:   Marland Kitchen Marital Status:   Intimate Partner Violence:   . Fear of Current or Ex-Partner:   . Emotionally Abused:   Marland Kitchen Physically Abused:   . Sexually Abused:    History reviewed. No pertinent family history.  OBJECTIVE:  Vitals:   06/29/20 1051 06/29/20 1055  BP:  (!) 99/60  Pulse:  84  Resp:  17  Temp:  98.5 F (36.9 C)  TempSrc:  Oral  SpO2:  96%  Weight: 130 lb (59 kg)   Height: 5\' 3"  (1.6 m)      General appearance: alert; appears fatigued, but nontoxic; speaking in full sentences and tolerating own secretions HEENT: NCAT; Ears: EACs clear, TMs pearly gray; Eyes: PERRL.  EOM grossly intact. Nose: nares patent with clear rhinorrhea, Throat: oropharynx clear, tonsils non erythematous or enlarged, uvula midline  Neck: supple without LAD Lungs: unlabored respirations, symmetrical air entry; cough: absent; no respiratory distress; CTAB Heart: regular rate and rhythm.  Radial pulses 2+ symmetrical bilaterally Skin: warm and dry Psychological: alert and cooperative; normal mood and affect  ASSESSMENT &  PLAN:  1. Cough   2. Viral URI with cough   3. Suspected COVID-19 virus infection     Meds ordered this encounter  Medications  . benzonatate (TESSALON) 100 MG capsule    Sig: Take 1 capsule (100 mg total) by mouth every 8 (eight) hours.    Dispense:  21 capsule    Refill:  0    Order Specific Question:   Supervising Provider    Answer:   Eustace Moore   COVID testing ordered.  It will take between 2-5 days for test results.  Someone will contact you regarding abnormal results.    In the meantime: You should remain isolated in your home for 10 days from symptom onset AND greater than 72 hours after symptoms resolution (absence of fever without the use of fever-reducing medication and improvement in respiratory symptoms), whichever is longer Get plenty of rest and push fluids Tessalon Perles prescribed for cough Use OTC zyrtec for nasal congestion, runny nose, and/or sore throat Use OTC flonase for nasal congestion and runny nose Use medications daily for symptom relief Use OTC medications like ibuprofen or tylenol as needed fever or pain Follow up with PCP regarding results Call or go to the ED if you have any new or worsening symptoms such as fever, worsening cough, shortness of breath, chest tightness, chest pain, turning blue, changes in mental status, etc...   Reviewed expectations re: course of current medical issues. Questions answered. Outlined signs and symptoms indicating need for more acute intervention. Patient verbalized understanding. After Visit Summary given.         [[id number]], PA-C 06/29/20 1137

## 2020-06-29 NOTE — ED Triage Notes (Signed)
Fever, body aches, cough and fatigue since Saturday

## 2020-06-30 LAB — NOVEL CORONAVIRUS, NAA: SARS-CoV-2, NAA: DETECTED — AB

## 2020-06-30 LAB — SARS-COV-2, NAA 2 DAY TAT

## 2020-07-11 ENCOUNTER — Other Ambulatory Visit: Payer: Self-pay

## 2020-07-11 ENCOUNTER — Ambulatory Visit
Admission: EM | Admit: 2020-07-11 | Discharge: 2020-07-11 | Disposition: A | Discharge status: Home / Self Care | Payer: BC Managed Care – PPO

## 2020-07-26 ENCOUNTER — Ambulatory Visit
Admission: EM | Admit: 2020-07-26 | Discharge: 2020-07-26 | Disposition: A | Discharge status: Home / Self Care | Payer: BC Managed Care – PPO | Attending: Emergency Medicine | Admitting: Emergency Medicine

## 2020-07-26 ENCOUNTER — Other Ambulatory Visit: Payer: Self-pay

## 2020-07-26 DIAGNOSIS — Z0189 Encounter for other specified special examinations: Secondary | ICD-10-CM | POA: Diagnosis not present

## 2020-07-26 NOTE — ED Triage Notes (Signed)
Pt presents for covid test for work. Denies any symptoms. Educated on return precautions.

## 2020-07-26 NOTE — Discharge Instructions (Signed)

## 2020-07-27 LAB — SARS-COV-2, NAA 2 DAY TAT

## 2020-07-27 LAB — NOVEL CORONAVIRUS, NAA: SARS-CoV-2, NAA: NOT DETECTED

## 2020-08-23 ENCOUNTER — Other Ambulatory Visit: Payer: Self-pay

## 2020-08-23 ENCOUNTER — Encounter: Payer: Self-pay | Admitting: Emergency Medicine

## 2020-08-23 ENCOUNTER — Ambulatory Visit
Admission: EM | Admit: 2020-08-23 | Discharge: 2020-08-23 | Disposition: A | Discharge status: Home / Self Care | Payer: BC Managed Care – PPO | Attending: Family Medicine | Admitting: Family Medicine

## 2020-08-23 DIAGNOSIS — B029 Zoster without complications: Secondary | ICD-10-CM | POA: Diagnosis not present

## 2020-08-23 DIAGNOSIS — R21 Rash and other nonspecific skin eruption: Secondary | ICD-10-CM

## 2020-08-23 MED ORDER — VALACYCLOVIR HCL 1 G PO TABS: 1000.0000 mg | ORAL_TABLET | Freq: Three times a day (TID) | ORAL | 0 refills | Status: AC

## 2020-08-23 MED ORDER — PREDNISONE 10 MG (21) PO TBPK: ORAL_TABLET | Freq: Every day | ORAL | 0 refills | Status: AC

## 2020-08-23 MED ORDER — TRIAMCINOLONE ACETONIDE 0.1 % EX CREA: 1.0000 "application " | TOPICAL_CREAM | Freq: Two times a day (BID) | CUTANEOUS | 0 refills | Status: DC

## 2020-08-23 NOTE — Discharge Instructions (Addendum)
I have sent in Valtrex three times per day for 7 days  I have sent in a prednisone taper for you to take for 6 days. 6 tablets on day one, 5 tablets on day two, 4 tablets on day three, 3 tablets on day four, 2 tablets on day five, and 1 tablet on day six.  I have sent in triamcinolone cream for you to use twice a day to the rash  Follow up with this office or with primary care as needed

## 2020-08-23 NOTE — ED Provider Notes (Signed)
[facility]   [id number] 08/23/20 Arrival Time: 0907  CC: RASH  SUBJECTIVE:  Lori Sawyer is a 64 y.o. female who presents with a skin complaint that began 2 days ago.  Reports that she had Covid about a month ago.  Reports that she is now experiencing a rash to left side of her abdomen into her that is red and very painful.  Reports that she has history of shingles.  Has not had shingles vaccines.  Denies changes in soaps, detergents, close contacts with similar rash, known trigger or environmental trigger, allergy. Denies medications change or starting a new medication recently.  Symptoms are aggravated by close rubbing on the area. Denies fever, chills, nausea, vomiting, swelling, discharge, oral lesions, SOB, chest pain, abdominal pain, changes in bowel or bladder function.    ROS: As per HPI.  All other pertinent ROS negative.     Past Medical History:  Diagnosis Date  . Arthritis    Past Surgical History:  Procedure Laterality Date  . CESAREAN SECTION    . ELBOW SURGERY    . KNEE SURGERY     No Known Allergies No current facility-administered medications on file prior to encounter.   Current Outpatient Medications on File Prior to Encounter  Medication Sig Dispense Refill  . atorvastatin (LIPITOR) 10 MG tablet Take 10 mg by mouth daily.    . benzonatate (TESSALON) 100 MG capsule Take 1 capsule (100 mg total) by mouth every 8 (eight) hours. 21 capsule 0  . cephALEXin (KEFLEX) 500 MG capsule Take 1 capsule (500 mg total) by mouth 4 (four) times daily. 40 capsule 0  . cholecalciferol (VITAMIN D) 1000 units tablet Take 1,000 Units by mouth daily.    Marland Kitchen estradiol (ESTRACE) 2 MG tablet Take 2 mg by mouth daily.    Marland Kitchen HYDROcodone-acetaminophen (NORCO/VICODIN) 5-325 MG tablet Take 2 tablets by mouth every 4 (four) hours as needed. 20 tablet 0  . levothyroxine (SYNTHROID, LEVOTHROID) 100 MCG tablet Take 100 mcg by mouth daily before breakfast.    . Multiple Vitamin  (MULTIVITAMIN WITH MINERALS) TABS Take 1 tablet by mouth daily.     Social History   Socioeconomic History  . Marital status: Married    Spouse name: Not on file  . Number of children: Not on file  . Years of education: Not on file  . Highest education level: Not on file  Occupational History  . Not on file  Tobacco Use  . Smoking status: Former Smoker    Packs/day: 0.25  . Smokeless tobacco: Never Used  Substance and Sexual Activity  . Alcohol use: No  . Drug use: No  . Sexual activity: Not on file  Other Topics Concern  . Not on file  Social History Narrative  . Not on file   Social Determinants of Health   Financial Resource Strain:   . Difficulty of Paying Living Expenses: Not on file  Food Insecurity:   . Worried About Programme researcher, broadcasting/film/video in the Last Year: Not on file  . Ran Out of Food in the Last Year: Not on file  Transportation Needs:   . Lack of Transportation (Medical): Not on file  . Lack of Transportation (Non-Medical): Not on file  Physical Activity:   . Days of Exercise per Week: Not on file  . Minutes of Exercise per Session: Not on file  Stress:   . Feeling of Stress : Not on file  Social Connections:   . Frequency of  Communication with Friends and Family: Not on file  . Frequency of Social Gatherings with Friends and Family: Not on file  . Attends Religious Services: Not on file  . Active Member of Clubs or Organizations: Not on file  . Attends Banker Meetings: Not on file  . Marital Status: Not on file  Intimate Partner Violence:   . Fear of Current or Ex-Partner: Not on file  . Emotionally Abused: Not on file  . Physically Abused: Not on file  . Sexually Abused: Not on file   No family history on file.  OBJECTIVE: Vitals:   08/23/20 0916  BP: 106/76  Pulse: 75  Resp: 16  Temp: (!) 97.5 F (36.4 C)  TempSrc: Oral  SpO2: 97%    General appearance: alert; no distress Head: NCAT Lungs: clear to auscultation  bilaterally Heart: regular rate and rhythm.  Radial pulse 2+ bilaterally Extremities: no edema Skin: warm and dry; erythematous vesicular rash to left abdomen following the dermatomal onto the left back, consistent with herpes zoster Psychological: alert and cooperative; normal mood and affect  ASSESSMENT & PLAN:  1. Herpes zoster without complication   2. Rash and nonspecific skin eruption     Meds ordered this encounter  Medications  . valACYclovir (VALTREX) 1000 MG tablet    Sig: Take 1 tablet (1,000 mg total) by mouth 3 (three) times daily for 7 days.    Dispense:  21 tablet    Refill:  0    Order Specific Question:   Supervising Provider    Answer:   Merrilee Jansky [id number]  . predniSONE (STERAPRED UNI-PAK 21 TAB) 10 MG (21) TBPK tablet    Sig: Take by mouth daily for 6 days. Take 6 tablets on day 1, 5 tablets on day 2, 4 tablets on day 3, 3 tablets on day 4, 2 tablets on day 5, 1 tablet on day 6    Dispense:  21 tablet    Refill:  0    Order Specific Question:   Supervising Provider    Answer:   Merrilee Jansky [id number]  . triamcinolone cream (KENALOG) 0.1 %    Sig: Apply 1 application topically 2 (two) times daily.    Dispense:  30 g    Refill:  0    Order Specific Question:   Supervising Provider    Answer:   Merrilee Jansky [[id number]]    Prescribed Valtrex Prescribe steroid taper Prescribed triamcinolone cream Take as prescribed and to completion Avoid hot showers/ baths Moisturize skin daily  Follow up with PCP if symptoms persists Return or go to the ER if you have any new or worsening symptoms such as fever, chills, nausea, vomiting, redness, swelling, discharge, if symptoms do not improve with medications  Reviewed expectations re: course of current medical issues. Questions answered. Outlined signs and symptoms indicating need for more acute intervention. Patient verbalized understanding. After Visit Summary given.   Moshe Cipro,  NP 08/23/20 1549

## 2020-08-23 NOTE — ED Triage Notes (Signed)
Patient states that she has recently had covid- now has shingles on left side x 2 days with pain.

## 2021-05-05 ENCOUNTER — Other Ambulatory Visit: Payer: Self-pay | Admitting: Internal Medicine

## 2021-05-05 DIAGNOSIS — E785 Hyperlipidemia, unspecified: Secondary | ICD-10-CM

## 2021-06-02 ENCOUNTER — Ambulatory Visit
Admission: RE | Admit: 2021-06-02 | Discharge: 2021-06-02 | Disposition: A | Discharge status: Home / Self Care | Payer: BC Managed Care – PPO | Source: Ambulatory Visit | Attending: Internal Medicine | Admitting: Internal Medicine

## 2021-06-02 DIAGNOSIS — E785 Hyperlipidemia, unspecified: Secondary | ICD-10-CM

## 2021-06-07 ENCOUNTER — Other Ambulatory Visit: Payer: Self-pay | Admitting: Internal Medicine

## 2021-06-07 DIAGNOSIS — Z1231 Encounter for screening mammogram for malignant neoplasm of breast: Secondary | ICD-10-CM

## 2021-07-31 ENCOUNTER — Ambulatory Visit
Admission: RE | Admit: 2021-07-31 | Discharge: 2021-07-31 | Disposition: A | Discharge status: Home / Self Care | Payer: BC Managed Care – PPO | Source: Ambulatory Visit | Attending: Internal Medicine | Admitting: Internal Medicine

## 2021-07-31 ENCOUNTER — Other Ambulatory Visit: Payer: Self-pay

## 2021-07-31 DIAGNOSIS — Z1231 Encounter for screening mammogram for malignant neoplasm of breast: Secondary | ICD-10-CM

## 2022-06-25 ENCOUNTER — Other Ambulatory Visit: Payer: Self-pay | Admitting: Internal Medicine

## 2022-06-25 DIAGNOSIS — R140 Abdominal distension (gaseous): Secondary | ICD-10-CM

## 2022-07-05 ENCOUNTER — Ambulatory Visit
Admission: RE | Admit: 2022-07-05 | Discharge: 2022-07-05 | Disposition: A | Discharge status: Home / Self Care | Payer: BC Managed Care – PPO | Source: Ambulatory Visit | Attending: Internal Medicine | Admitting: Internal Medicine

## 2022-07-05 DIAGNOSIS — R140 Abdominal distension (gaseous): Secondary | ICD-10-CM

## 2022-09-18 ENCOUNTER — Other Ambulatory Visit: Payer: Self-pay | Admitting: Internal Medicine

## 2022-09-18 DIAGNOSIS — Z1231 Encounter for screening mammogram for malignant neoplasm of breast: Secondary | ICD-10-CM

## 2022-09-21 ENCOUNTER — Ambulatory Visit
Admission: RE | Admit: 2022-09-21 | Discharge: 2022-09-21 | Disposition: A | Discharge status: Home / Self Care | Payer: BC Managed Care – PPO | Source: Ambulatory Visit | Attending: Internal Medicine | Admitting: Internal Medicine

## 2022-09-21 DIAGNOSIS — Z1231 Encounter for screening mammogram for malignant neoplasm of breast: Secondary | ICD-10-CM

## 2022-12-06 ENCOUNTER — Encounter: Payer: Self-pay | Admitting: Internal Medicine

## 2023-01-25 ENCOUNTER — Other Ambulatory Visit: Payer: Self-pay | Admitting: Internal Medicine

## 2023-01-25 DIAGNOSIS — R109 Unspecified abdominal pain: Secondary | ICD-10-CM

## 2023-02-01 ENCOUNTER — Ambulatory Visit
Admission: RE | Admit: 2023-02-01 | Discharge: 2023-02-01 | Disposition: A | Discharge status: Home / Self Care | Payer: BC Managed Care – PPO | Source: Ambulatory Visit | Attending: Internal Medicine | Admitting: Internal Medicine

## 2023-02-01 DIAGNOSIS — R109 Unspecified abdominal pain: Secondary | ICD-10-CM

## 2023-02-01 MED ORDER — IOPAMIDOL (ISOVUE-300) INJECTION 61%
80.0000 mL | Freq: Once | INTRAVENOUS | Status: AC | PRN
  Administered 2023-02-01: 80 mL via INTRAVENOUS

## 2023-06-10 ENCOUNTER — Encounter: Payer: Self-pay | Admitting: Gastroenterology

## 2023-08-26 ENCOUNTER — Other Ambulatory Visit: Payer: Self-pay

## 2023-08-27 ENCOUNTER — Ambulatory Visit: Payer: BC Managed Care – PPO | Admitting: Gastroenterology

## 2023-08-27 NOTE — Progress Notes (Deleted)
[facility] Consult Note:  History: Lori Sawyer 08/27/2023  Referring provider: Rodrigo Ran, MD  Reason for consult/chief complaint: No chief complaint on file.   Subjective  HPI: Lori Sawyer was referred by her primary care provider for evaluation of bloating and weight gain as well as for consideration of colonoscopy.  She reports having had her last colonoscopy by physician in [place] in about 2007.  No records found (predates this EMR). PCP has recommended she try some time off certain meds to see if changed her bloating, but without improvement.  CT abdomen and pelvis March of this year with incidental left-sided diverticulosis, exam unrevealing for source of symptoms (see report below). ***   ROS:  Review of Systems   Past Medical History: Past Medical History:  Diagnosis Date   Arthritis      Past Surgical History: Past Surgical History:  Procedure Laterality Date   CESAREAN SECTION     ELBOW SURGERY     KNEE SURGERY       Family History: Family History  Problem Relation Age of Onset   Breast cancer Neg Hx     Social History: Social History   Socioeconomic History   Marital status: Married    Spouse name: Not on file   Number of children: Not on file   Years of education: Not on file   Highest education level: Not on file  Occupational History   Not on file  Tobacco Use   Smoking status: Former    Current packs/day: 0.25    Types: Cigarettes   Smokeless tobacco: Never  Substance and Sexual Activity   Alcohol use: No   Drug use: No   Sexual activity: Not on file  Other Topics Concern   Not on file  Social History Narrative   Not on file   Social Determinants of Health   Financial Resource Strain: Not on file  Food Insecurity: Not on file  Transportation Needs: Not on file  Physical Activity: Not on file  Stress: Not on file  Social Connections: Not on file    Allergies: No Known  Allergies  Outpatient Meds: Current Outpatient Medications  Medication Sig Dispense Refill   atorvastatin (LIPITOR) 10 MG tablet Take 10 mg by mouth daily.     benzonatate (TESSALON) 100 MG capsule Take 1 capsule (100 mg total) by mouth every 8 (eight) hours. 21 capsule 0   Calcium-Phosphorus-Vitamin D (CITRACAL CALCIUM GUMMIES PO) 2 in a.m. and 2 in p.m. with food     cephALEXin (KEFLEX) 500 MG capsule Take 1 capsule (500 mg total) by mouth 4 (four) times daily. 40 capsule 0   cholecalciferol (VITAMIN D) 1000 units tablet Take 1,000 Units by mouth daily.     estradiol (ESTRACE) 2 MG tablet Take 2 mg by mouth daily.     HYDROcodone-acetaminophen (NORCO/VICODIN) 5-325 MG tablet Take 2 tablets by mouth every 4 (four) hours as needed. 20 tablet 0   levothyroxine (SYNTHROID, LEVOTHROID) 100 MCG tablet Take 100 mcg by mouth daily before breakfast.     Multiple Vitamin (MULTIVITAMIN WITH MINERALS) TABS Take 1 tablet by mouth daily.     triamcinolone cream (KENALOG) 0.1 % Apply 1 application topically 2 (two) times daily. 30 g 0   No current facility-administered medications for this visit.      ___________________________________________________________________ Objective   Exam:  There were no vitals taken for this visit. Wt Readings from Last 3 Encounters:  06/29/20 130 lb (59 kg)  03/22/16  120 lb (54.4 kg)  04/28/13 112 lb (50.8 kg)    General: ***  Eyes: sclera anicteric, no redness ENT: oral mucosa moist without lesions, no cervical or supraclavicular lymphadenopathy CV: ***, no JVD, no peripheral edema Resp: clear to auscultation bilaterally, normal RR and effort noted GI: soft, *** tenderness, with active bowel sounds. No guarding or palpable organomegaly noted. Skin; warm and dry, no rash or jaundice noted Neuro: awake, alert and oriented x 3. Normal gross motor function and fluent speech  Labs:  Most recent labs accompanying her referral were from July 2023-normal CBC,  CMP and TSH  Radiologic Studies:  CLINICAL DATA:  Abdominal pain, bloating and nausea.  Weight gain.   EXAM: CT ABDOMEN AND PELVIS WITH CONTRAST   TECHNIQUE: Multidetector CT imaging of the abdomen and pelvis was performed using the standard protocol following bolus administration of intravenous contrast.   RADIATION DOSE REDUCTION: This exam was performed according to the departmental dose-optimization program which includes automated exposure control, adjustment of the mA and/or kV according to patient size and/or use of iterative reconstruction technique.   CONTRAST:  80mL ISOVUE-300 IOPAMIDOL (ISOVUE-300) INJECTION 61%   COMPARISON:  CT abdomen pelvis dated 03/22/2016.   FINDINGS: Lower chest: The visualized lung bases are clear.   No intra-abdominal free air or free fluid.   Hepatobiliary: The liver is unremarkable. No biliary dilatation. No calcified gallstone or pericholecystic fluid. Focal thickening of the gallbladder fundus, likely adenomyomatosis.   Pancreas: Unremarkable. No pancreatic ductal dilatation or surrounding inflammatory changes.   Spleen: Normal in size without focal abnormality.   Adrenals/Urinary Tract: The adrenal glands unremarkable. There is no hydronephrosis on either side. There is symmetric enhancement and excretion of contrast by both kidneys. The visualized ureters and urinary bladder appear unremarkable.   Stomach/Bowel: There is sigmoid diverticulosis without active inflammatory changes. There is no bowel obstruction or active inflammation. The appendix is normal.   Vascular/Lymphatic: Mild aortoiliac atherosclerotic disease. The IVC is unremarkable. No portal venous gas. There is no adenopathy.   Reproductive: The uterus is anteverted and grossly unremarkable. No adnexal masses.   Other: None   Musculoskeletal: No acute or significant osseous findings.   IMPRESSION: 1. No acute intra-abdominal or pelvic pathology. 2.  Sigmoid diverticulosis. No bowel obstruction. Normal appendix. 3.  Aortic Atherosclerosis (ICD10-I70.0).     Electronically Signed   By: Elgie Collard M.D.   On: 02/01/2023 20:35  Assessment: No diagnosis found.  ***  Plan:  ***  Thank you for the courtesy of this consult.  Please call me with any questions or concerns.  Charlie Pitter III  CC: Referring provider noted above

## 2023-09-18 ENCOUNTER — Ambulatory Visit (INDEPENDENT_AMBULATORY_CARE_PROVIDER_SITE_OTHER): Payer: BC Managed Care – PPO | Admitting: Gastroenterology

## 2023-09-18 VITALS — BP 130/80 | HR 85 | Ht 63.0 in

## 2023-09-18 DIAGNOSIS — R140 Abdominal distension (gaseous): Secondary | ICD-10-CM

## 2023-09-18 DIAGNOSIS — R1084 Generalized abdominal pain: Secondary | ICD-10-CM | POA: Diagnosis not present

## 2023-09-18 MED ORDER — NA SULFATE-K SULFATE-MG SULF 17.5-3.13-1.6 GM/177ML PO SOLN: 1.0000 | Freq: Once | ORAL | 0 refills | Status: AC

## 2023-09-18 NOTE — Patient Instructions (Addendum)
You have been scheduled for a colonoscopy/endoscopy  Please follow written instructions given to you at your visit today.   Please pick up your prep supplies at the pharmacy within the next 1-3 days.  If you use inhalers (even only as needed), please bring them with you on the day of your procedure.  DO NOT TAKE 7 DAYS PRIOR TO TEST- Trulicity (dulaglutide) Ozempic, Wegovy (semaglutide) Mounjaro (tirzepatide) Bydureon Bcise (exanatide extended release)  DO NOT TAKE 1 DAY PRIOR TO YOUR TEST Rybelsus (semaglutide) Adlyxin (lixisenatide) Victoza (liraglutide) Byetta (exanatide) ___________________________________________________________________________  _______________________________________________________  Food Guidelines for those with chronic digestive trouble:  Many people have difficulty digesting certain foods, causing a variety of distressing and embarrassing symptoms such as abdominal pain, bloating and gas.  These foods may need to be avoided or consumed in small amounts.  Here are some tips that might be helpful for you.  1.   Lactose intolerance is the difficulty or complete inability to digest lactose, the natural sugar in milk and anything made from milk.  This condition is harmless, common, and can begin any time during life.  Some people can digest a modest amount of lactose while others cannot tolerate any.  Also, not all dairy products contain equal amounts of lactose.  For example, hard cheeses such as parmesan have less lactose than soft cheeses such as cheddar.  Yogurt has less lactose than milk or cheese.  Many packaged foods (even many brands of bread) have milk, so read ingredient lists carefully.  It is difficult to test for lactose intolerance, so just try avoiding lactose as much as possible for a week and see what happens with your symptoms.  If you seem to be lactose intolerant, the best plan is to avoid it (but make sure you get calcium from another source).  The  next best thing is to use lactase enzyme supplements, available over the counter everywhere.  Just know that many lactose intolerant people need to take several tablets with each serving of dairy to avoid symptoms.  Lastly, a lot of restaurant food is made with milk or butter.  Many are things you might not suspect, such as mashed potatoes, rice and pasta (cooked with butter) and "grilled" items.  If you are lactose intolerant, it never hurts to ask your server what has milk or butter.  2.   Fiber is an important part of your diet, but not all fiber is well-tolerated.  Insoluble fiber such as bran is often consumed by normal gut bacteria and converted into gas.  Soluble fiber such as oats, squash, carrots and green beans are typically tolerated better.  3.   Some types of carbohydrates can be poorly digested.  Examples include: fructose (apples, cherries, pears, raisins and other dried fruits), fructans (onions, zucchini, large amounts of wheat), sorbitol/mannitol/xylitol and sucralose/Splenda (common artificial sweeteners), and raffinose (lentils, broccoli, cabbage, asparagus, brussel sprouts, many types of beans).  Do a Programmer, multimedia for [employer] and you will find helpful information. Beano, a dietary supplement, will often help with raffinose-containing foods.  As with lactase tablets, you may need several per serving.  4.   Whenever possible, avoid processed food&meats and chemical additives.  High fructose corn syrup, a common sweetener, may be difficult to digest.  Eggs and soy (comes from the soybean, and added to many foods now) are other common bloating/gassy foods.  5.  Regarding gluten:  gluten is a protein mainly found in wheat, but also rye and barley.  There  is a condition called celiac sprue, which is an inflammatory reaction in the small intestine causing a variety of digestive symptoms.  Blood testing is highly reliable to look for this condition, and sometimes upper endoscopy with small  bowel biopsies may be necessary to make the diagnosis.  Many patients who test negative for celiac sprue report improvement in their digestive symptoms when they switch to a gluten-free diet.  However, in these "non-celiac gluten sensitive" patients, the true role of gluten in their symptoms is unclear.  Reducing carbohydrates in general may decrease the gas and bloating caused when gut bacteria consume carbs. Also, some of these patients may actually be intolerant of the baker's yeast in bread products rather than the gluten.  Flatbread and other reduced yeast breads might therefore be tolerated.  There is no specific testing available for most food intolerances, which are discovered mainly by dietary elimination.  Please do not embark on a gluten free diet unless directed by your doctor, as it is highly restrictive, and may lead to nutritional deficiencies if not carefully monitored.  Lastly, beware of internet claims offering "personalized" tests for food intolerances.  Such testing has no reliable scientific evidence to support its reliability and correlation to symptoms.    6.  The best advice is old advice, especially for those with chronic digestive trouble - try to eat "clean".  Balanced diet, avoid processed food, plenty of fruits and vegetables, cut down the sugar, minimal alcohol, avoid tobacco. Make time to care for yourself, get enough sleep, exercise when you can, reduce stress.  Your guts will thank you for it.   - Dr. [facility]  ____________________________________________________________     _______________________________________________________  If your blood pressure at your visit was 140/90 or greater, please contact your primary care physician to follow up on this.  _______________________________________________________  If you are age 66 or older, your body mass index should be between 23-30. Your Body mass index is 23.03 kg/m. If this is out of  the aforementioned range listed, please consider follow up with your Primary Care Provider.  If you are age 82 or younger, your body mass index should be between 19-25. Your Body mass index is 23.03 kg/m. If this is out of the aformentioned range listed, please consider follow up with your Primary Care Provider.   ________________________________________________________  The [facility] providers would like to encourage you to use [facility] to communicate with providers for non-urgent requests or questions.  Due to long hold times on the telephone, sending your provider a message by [facility] may be a faster and more efficient way to get a response.  Please allow 48 business hours for a response.  Please remember that this is for non-urgent requests.  _______________________________________________________ It was a pleasure to see you today!  Thank you for trusting me with your gastrointestinal care!
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
## 2023-10-20 ENCOUNTER — Encounter: Payer: Self-pay | Admitting: Certified Registered Nurse Anesthetist

## 2023-10-24 ENCOUNTER — Ambulatory Visit: Payer: BC Managed Care – PPO | Admitting: Gastroenterology

## 2023-10-24 ENCOUNTER — Encounter: Payer: Self-pay | Admitting: Gastroenterology

## 2023-10-24 VITALS — BP 124/65 | HR 84 | Temp 97.9°F | Resp 20 | Ht 63.0 in | Wt 140.0 lb

## 2023-10-24 DIAGNOSIS — R140 Abdominal distension (gaseous): Secondary | ICD-10-CM | POA: Diagnosis not present

## 2023-10-24 DIAGNOSIS — D123 Benign neoplasm of transverse colon: Secondary | ICD-10-CM

## 2023-10-24 DIAGNOSIS — D122 Benign neoplasm of ascending colon: Secondary | ICD-10-CM | POA: Diagnosis not present

## 2023-10-24 DIAGNOSIS — K3189 Other diseases of stomach and duodenum: Secondary | ICD-10-CM

## 2023-10-24 DIAGNOSIS — D124 Benign neoplasm of descending colon: Secondary | ICD-10-CM

## 2023-10-24 DIAGNOSIS — R635 Abnormal weight gain: Secondary | ICD-10-CM | POA: Diagnosis not present

## 2023-10-24 DIAGNOSIS — R1084 Generalized abdominal pain: Secondary | ICD-10-CM | POA: Diagnosis not present

## 2023-10-24 DIAGNOSIS — K5730 Diverticulosis of large intestine without perforation or abscess without bleeding: Secondary | ICD-10-CM | POA: Diagnosis not present

## 2023-10-24 DIAGNOSIS — K319 Disease of stomach and duodenum, unspecified: Secondary | ICD-10-CM | POA: Diagnosis not present

## 2023-10-24 MED ORDER — SODIUM CHLORIDE 0.9 % IV SOLN: 500.0000 mL | Freq: Once | INTRAVENOUS | Status: DC

## 2023-10-24 NOTE — Progress Notes (Signed)
Pt drank water last at 10am. Procedure to be done a 1pm.

## 2023-10-24 NOTE — Patient Instructions (Signed)
- Resume previous diet - Continue present medications. - Await pathology results - Repeat colonoscopy is recommended for surveillance. The colonoscopy date will be determined after pathology results from today's exam become available for review. -If EGD biopsies normal, patient will be tested for SIBO.   YOU HAD AN ENDOSCOPIC PROCEDURE TODAY AT THE [facility]:   Refer to the procedure report that was given to you for any specific questions about what was found during the examination.  If the procedure report does not answer your questions, please call your gastroenterologist to clarify.  If you requested that your care partner not be given the details of your procedure findings, then the procedure report has been included in a sealed envelope for you to review at your convenience later.  YOU SHOULD EXPECT: Some feelings of bloating in the abdomen. Passage of more gas than usual.  Walking can help get rid of the air that was put into your GI tract during the procedure and reduce the bloating. If you had a lower endoscopy (such as a colonoscopy or flexible sigmoidoscopy) you may notice spotting of blood in your stool or on the toilet paper. If you underwent a bowel prep for your procedure, you may not have a normal bowel movement for a few days.  Please Note:  You might notice some irritation and congestion in your nose or some drainage.  This is from the oxygen used during your procedure.  There is no need for concern and it should clear up in a day or so.  SYMPTOMS TO REPORT IMMEDIATELY:  Following lower endoscopy (colonoscopy or flexible sigmoidoscopy):  Excessive amounts of blood in the stool  Significant tenderness or worsening of abdominal pains  Swelling of the abdomen that is new, acute  Fever of 100F or higher  Following upper endoscopy (EGD)  Vomiting of blood or coffee ground material  New chest pain or pain under the shoulder blades  Painful or persistently difficult  swallowing  New shortness of breath  Fever of 100F or higher  Black, tarry-looking stools  For urgent or emergent issues, a gastroenterologist can be reached at any hour by calling [phone]. Do not use MyChart messaging for urgent concerns.    DIET:  We do recommend a small meal at first, but then you may proceed to your regular diet.  Drink plenty of fluids but you should avoid alcoholic beverages for 24 hours.  ACTIVITY:  You should plan to take it easy for the rest of today and you should NOT DRIVE or use heavy machinery until tomorrow (because of the sedation medicines used during the test).    FOLLOW UP: Our staff will call the number listed on your records the next business day following your procedure.  We will call around 7:15- 8:00 am to check on you and address any questions or concerns that you may have regarding the information given to you following your procedure. If we do not reach you, we will leave a message.     If any biopsies were taken you will be contacted by phone or by letter within the next 1-3 weeks.  Please call us at [phone] if you have not heard about the biopsies in 3 weeks.    SIGNATURES/CONFIDENTIALITY: You and/or your care partner have signed paperwork which will be entered into your electronic medical record.  These signatures attest to the fact that that the information above on your After Visit Summary has been reviewed and is understood.  Full responsibility of the confidentiality of this discharge information lies with you and/or your care-partner.

## 2023-10-24 NOTE — Progress Notes (Signed)
1325 Patient experiencing nausea and retching.  MD updated and Zofran 4 mg IV given, vss

## 2023-10-24 NOTE — Progress Notes (Signed)
Called to room to assist during endoscopic procedure.  Patient ID and intended procedure confirmed with present staff. Received instructions for my participation in the procedure from the performing physician.  

## 2023-10-24 NOTE — Op Note (Signed)
[facility] Patient Name: Lori Sawyer Procedure Date: 10/24/2023 12:54 PM MRN: [id number] Endoscopist: Sherilyn Cooter L. Myrtie Neither , MD, [id number] Age: 67 Referring MD:  Date of Birth: 1956-04-17 Gender: Female Account #: [id number] Procedure:                Colonoscopy Indications:              This is the patient's first colonoscopy,                            Generalized abdominal pain, bloating                           clinical details in recent office consult note Procedure:                Pre-Anesthesia Assessment:                           - Prior to the procedure, a History and Physical                            was performed, and patient medications and                            allergies were reviewed. The patient's tolerance of                            previous anesthesia was also reviewed. The risks                            and benefits of the procedure and the sedation                            options and risks were discussed with the patient.                            All questions were answered, and informed consent                            was obtained. Prior Anticoagulants: The patient has                            taken no anticoagulant or antiplatelet agents. ASA                            Grade Assessment: II - A patient with mild systemic                            disease. After reviewing the risks and benefits,                            the patient was deemed in satisfactory condition to                            undergo the procedure.  After obtaining informed consent, the colonoscope                            was passed under direct vision. Throughout the                            procedure, the patient's blood pressure, pulse, and                            oxygen saturations were monitored continuously. The                            CF [id number] #[id number] was introduced through the anus                            and  advanced to the the terminal ileum, with                            identification of the appendiceal orifice and IC                            valve. The colonoscopy was performed without                            difficulty. The patient tolerated the procedure                            well. The quality of the bowel preparation was                            excellent. The terminal ileum, ileocecal valve,                            appendiceal orifice, and rectum were photographed. Scope In: 1:09:05 PM Scope Out: 1:24:14 PM Scope Withdrawal Time: 0 hours 12 minutes 29 seconds  Total Procedure Duration: 0 hours 15 minutes 9 seconds  Findings:                 The perianal and digital rectal examinations were                            normal.                           The terminal ileum appeared normal.                           Repeat examination of right colon under NBI                            performed.                           Multiple diverticula were found in the entire colon.  Four sessile polyps were found in the descending                            colon, transverse colon and ascending colon. The                            polyps were 4 to 6 mm in size. These polyps were                            removed with a cold snare. Resection and retrieval                            were complete.                           The exam was otherwise without abnormality on                            direct and retroflexion views. Complications:            No immediate complications. Estimated Blood Loss:     Estimated blood loss was minimal. Impression:               - The examined portion of the ileum was normal.                           - Diverticulosis in the entire examined colon.                           - Four 4 to 6 mm polyps in the descending colon, in                            the transverse colon and in the ascending colon,                             removed with a cold snare. Resected and retrieved.                           - The examination was otherwise normal on direct                            and retroflexion views.                           No source of symptoms seen on this exam. Reported                            weight gain does not appear to be from a digestive                            cause. Recommendation:           - Patient has a contact number available for  emergencies. The signs and symptoms of potential                            delayed complications were discussed with the                            patient. Return to normal activities tomorrow.                            Written discharge instructions were provided to the                            patient.                           - Resume previous diet.                           - Continue present medications.                           - Await pathology results.                           - Repeat colonoscopy is recommended for                            surveillance. The colonoscopy date will be                            determined after pathology results from today's                            exam become available for review.                           -If EGD biopsies normal, patient will be tested for                            SIBO. Evonne Rinks L. Myrtie Neither, MD 10/24/2023 1:34:59 PM This report has been signed electronically.

## 2023-10-24 NOTE — Op Note (Signed)
[facility] Patient Name: Lori Sawyer Procedure Date: 10/24/2023 12:54 PM MRN: [id number] Endoscopist: Sherilyn Cooter L. Myrtie Neither , MD, [id number] Age: 67 Referring MD:  Date of Birth: [birth year] Gender: Female Account #: [id number] Procedure:                Upper GI endoscopy Indications:              Generalized abdominal pain, Abdominal bloating,                            weight gain                           Clinical details in recent office consult note                           Unrevealing CTAP earlier this year Medicines:                Monitored Anesthesia Care Procedure:                Pre-Anesthesia Assessment:                           - Prior to the procedure, a History and Physical                            was performed, and patient medications and                            allergies were reviewed. The patient's tolerance of                            previous anesthesia was also reviewed. The risks                            and benefits of the procedure and the sedation                            options and risks were discussed with the patient.                            All questions were answered, and informed consent                            was obtained. Prior Anticoagulants: The patient has                            taken no anticoagulant or antiplatelet agents. ASA                            Grade Assessment: II - A patient with mild systemic                            disease. After reviewing the risks and benefits,  the patient was deemed in satisfactory condition to                            undergo the procedure.                           After obtaining informed consent, the endoscope was                            passed under direct vision. Throughout the                            procedure, the patient's blood pressure, pulse, and                            oxygen saturations were monitored continuously. The                             GIF HQ190 #[id number] was introduced through the                            mouth, and advanced to the second part of duodenum.                            The upper GI endoscopy was accomplished without                            difficulty. The patient tolerated the procedure                            well. Scope In: Scope Out: Findings:                 The esophagus was normal.                           Normal mucosa was found in the entire examined                            stomach. Biopsies were taken with a cold forceps                            for histology. (Antrum and body in 1 jar)                           The stomach was normal.                           The cardia and gastric fundus were normal on                            retroflexion.                           Normal mucosa was found in the entire duodenum.  Biopsies for histology were taken with a cold                            forceps for evaluation of celiac disease. Complications:            No immediate complications. Estimated Blood Loss:     Estimated blood loss was minimal. Impression:               - Normal esophagus.                           - Normal mucosa was found in the entire stomach.                            Biopsied.                           - Normal stomach.                           - Normal mucosa was found in the entire examined                            duodenum. Biopsied.                           No visible source of symptoms on this exam. Recommendation:           - Patient has a contact number available for                            emergencies. The signs and symptoms of potential                            delayed complications were discussed with the                            patient. Return to normal activities tomorrow.                            Written discharge instructions were provided to the                            patient.                            - Resume previous diet.                           - Continue present medications.                           - Await pathology results.                           - See the other procedure note for documentation of  additional recommendations. Terrell Ostrand L. Myrtie Neither, MD 10/24/2023 1:28:41 PM This report has been signed electronically.

## 2023-10-24 NOTE — Progress Notes (Signed)
History and Physical:  This patient presents for endoscopic testing for: Encounter Diagnoses  Name Primary?   Abdominal bloating Yes   Generalized abdominal pain     Clinical details in office consult note of 09/18/2023. Generalized abdominal discomfort with bloating and weight gain.  No source found on CT abdomen pelvis March of this year.  Patient is otherwise without complaints or active issues today.  (Patient was drinking water upon arrival to the endoscopy lab, thus her procedure has to be delayed 2 hours) Past Medical History: Past Medical History:  Diagnosis Date   GERD (gastroesophageal reflux disease)    Hypothyroidism    Osteoarthritis    Osteopenia      Past Surgical History: Past Surgical History:  Procedure Laterality Date   CESAREAN SECTION     x3, last one in 1990   ELBOW SURGERY Right    KNEE SURGERY Right     Allergies: Allergies  Allergen Reactions   Atorvastatin     Other Reaction(s): pain, felt heavy, muscle spasms in legs.    Outpatient Meds: Current Outpatient Medications  Medication Sig Dispense Refill   Calcium-Phosphorus-Vitamin D (CITRACAL CALCIUM GUMMIES PO) 2 in a.m. and 2 in p.m. with food     cholecalciferol (VITAMIN D) 1000 units tablet Take 1,000 Units by mouth daily.     famotidine (PEPCID) 20 MG tablet Take 1 tablet with meals Oral     ibandronate (BONIVA) 150 MG tablet take one pill once a month as directed Orally once a month for 90 days     levothyroxine (SYNTHROID, LEVOTHROID) 100 MCG tablet Take 100 mcg by mouth daily before breakfast.     Multiple Vitamins-Minerals (CENTRUM MULTIGUMMIES PO) CENTRUM  MULTIGUMMIES DAILY     Current Facility-Administered Medications  Medication Dose Route Frequency Provider Last Rate Last Admin   0.9 %  sodium chloride infusion  500 mL Intravenous Once Danis, Andreas Blower, MD          ___________________________________________________________________ Objective   Exam:  BP (!) 152/73    Pulse 95   Temp 98.2 F (36.8 C) (Temporal)   Ht 5\' 3"  (1.6 m)   Wt 140 lb (63.5 kg)   SpO2 96%   BMI 24.80 kg/m   CV: regular , S1/S2 Resp: clear to auscultation bilaterally, normal RR and effort noted GI: soft, no tenderness, with active bowel sounds.   Assessment: Encounter Diagnoses  Name Primary?   Abdominal bloating Yes   Generalized abdominal pain      Plan: Colonoscopy EGD  The benefits and risks of the planned procedure were described in detail with the patient or (when appropriate) their health care proxy.  Risks were outlined as including, but not limited to, bleeding, infection, perforation, adverse medication reaction leading to cardiac or pulmonary decompensation, pancreatitis (if ERCP).  The limitation of incomplete mucosal visualization was also discussed.  No guarantees or warranties were given.  The patient is appropriate for an endoscopic procedure in the ambulatory setting.   - Amada Jupiter, MD

## 2023-10-24 NOTE — Progress Notes (Signed)
1255 Robinul 0.1 mg IV given due large amount of secretions upon assessment.  MD made aware, vss

## 2023-10-25 ENCOUNTER — Telehealth: Payer: Self-pay

## 2023-10-25 NOTE — Telephone Encounter (Signed)
Left message on follow up call. 

## 2023-10-29 LAB — SURGICAL PATHOLOGY

## 2023-10-30 ENCOUNTER — Encounter: Payer: Self-pay | Admitting: Gastroenterology

## 2024-02-07 ENCOUNTER — Other Ambulatory Visit: Payer: Self-pay | Admitting: Internal Medicine

## 2024-02-07 DIAGNOSIS — Z1231 Encounter for screening mammogram for malignant neoplasm of breast: Secondary | ICD-10-CM

## 2024-02-20 ENCOUNTER — Ambulatory Visit
Admission: RE | Admit: 2024-02-20 | Discharge: 2024-02-20 | Disposition: A | Discharge status: Home / Self Care | Source: Ambulatory Visit | Attending: Internal Medicine | Admitting: Internal Medicine

## 2024-02-20 DIAGNOSIS — Z1231 Encounter for screening mammogram for malignant neoplasm of breast: Secondary | ICD-10-CM
